# Patient Record
Sex: Female | Born: 1950 | Race: White | Hispanic: No | Marital: Married | Smoking: Former smoker
Health system: Southern US, Community
[De-identification: ages and names within clinical notes are randomized; demographics above are authoritative.]

## PROBLEM LIST (undated history)

## (undated) DIAGNOSIS — J449 Chronic obstructive pulmonary disease, unspecified: Secondary | ICD-10-CM

## (undated) DIAGNOSIS — I10 Essential (primary) hypertension: Secondary | ICD-10-CM

## (undated) DIAGNOSIS — J189 Pneumonia, unspecified organism: Secondary | ICD-10-CM

## (undated) DIAGNOSIS — C801 Malignant (primary) neoplasm, unspecified: Secondary | ICD-10-CM

## (undated) HISTORY — PX: BREAST SURGERY: SHX581

## (undated) HISTORY — PX: SKIN GRAFT: SHX250

## (undated) HISTORY — PX: OTHER SURGICAL HISTORY: SHX169

## (undated) HISTORY — PX: APPENDECTOMY: SHX54

## (undated) HISTORY — PX: TONSILLECTOMY: SUR1361

---

## 1999-05-06 ENCOUNTER — Emergency Department (HOSPITAL_COMMUNITY): Admission: EM | Admit: 1999-05-06 | Discharge: 1999-05-06 | Payer: Self-pay | Admitting: Emergency Medicine

## 1999-08-18 ENCOUNTER — Other Ambulatory Visit: Admission: RE | Admit: 1999-08-18 | Discharge: 1999-08-18 | Payer: Self-pay | Admitting: Obstetrics and Gynecology

## 1999-09-01 ENCOUNTER — Encounter: Admission: RE | Admit: 1999-09-01 | Discharge: 1999-09-01 | Payer: Self-pay | Admitting: Obstetrics and Gynecology

## 1999-09-01 ENCOUNTER — Encounter: Payer: Self-pay | Admitting: Obstetrics and Gynecology

## 2000-11-19 ENCOUNTER — Other Ambulatory Visit: Admission: RE | Admit: 2000-11-19 | Discharge: 2000-11-19 | Payer: Self-pay | Admitting: Obstetrics and Gynecology

## 2001-09-10 ENCOUNTER — Encounter: Payer: Self-pay | Admitting: Obstetrics and Gynecology

## 2001-09-10 ENCOUNTER — Encounter: Admission: RE | Admit: 2001-09-10 | Discharge: 2001-09-10 | Payer: Self-pay | Admitting: Obstetrics and Gynecology

## 2001-12-04 ENCOUNTER — Other Ambulatory Visit: Admission: RE | Admit: 2001-12-04 | Discharge: 2001-12-04 | Payer: Self-pay | Admitting: Obstetrics and Gynecology

## 2003-01-25 ENCOUNTER — Other Ambulatory Visit: Admission: RE | Admit: 2003-01-25 | Discharge: 2003-01-25 | Payer: Self-pay | Admitting: Obstetrics and Gynecology

## 2003-03-09 ENCOUNTER — Encounter: Admission: RE | Admit: 2003-03-09 | Discharge: 2003-03-09 | Payer: Self-pay | Admitting: Obstetrics and Gynecology

## 2004-02-03 ENCOUNTER — Other Ambulatory Visit: Admission: RE | Admit: 2004-02-03 | Discharge: 2004-02-03 | Payer: Self-pay | Admitting: Obstetrics and Gynecology

## 2004-02-14 ENCOUNTER — Encounter: Admission: RE | Admit: 2004-02-14 | Discharge: 2004-02-14 | Payer: Self-pay | Admitting: Obstetrics and Gynecology

## 2005-02-27 ENCOUNTER — Other Ambulatory Visit: Admission: RE | Admit: 2005-02-27 | Discharge: 2005-02-27 | Payer: Self-pay | Admitting: Obstetrics and Gynecology

## 2005-02-27 ENCOUNTER — Encounter: Admission: RE | Admit: 2005-02-27 | Discharge: 2005-02-27 | Payer: Self-pay | Admitting: Obstetrics and Gynecology

## 2006-03-04 ENCOUNTER — Encounter: Admission: RE | Admit: 2006-03-04 | Discharge: 2006-03-04 | Payer: Self-pay | Admitting: Obstetrics and Gynecology

## 2007-03-10 ENCOUNTER — Encounter: Admission: RE | Admit: 2007-03-10 | Discharge: 2007-03-10 | Payer: Self-pay | Admitting: Obstetrics and Gynecology

## 2008-04-20 ENCOUNTER — Encounter: Admission: RE | Admit: 2008-04-20 | Discharge: 2008-04-20 | Payer: Self-pay | Admitting: Obstetrics and Gynecology

## 2009-07-13 ENCOUNTER — Encounter: Admission: RE | Admit: 2009-07-13 | Discharge: 2009-07-13 | Payer: Self-pay | Admitting: Obstetrics and Gynecology

## 2010-05-12 ENCOUNTER — Encounter: Payer: Self-pay | Admitting: Pulmonary Disease

## 2010-05-12 ENCOUNTER — Ambulatory Visit (INDEPENDENT_AMBULATORY_CARE_PROVIDER_SITE_OTHER): Payer: 59 | Admitting: Pulmonary Disease

## 2010-05-12 VITALS — BP 128/66 | HR 101 | Temp 98.8°F | Ht 60.0 in | Wt 109.6 lb

## 2010-05-12 DIAGNOSIS — J439 Emphysema, unspecified: Secondary | ICD-10-CM | POA: Insufficient documentation

## 2010-05-12 DIAGNOSIS — J449 Chronic obstructive pulmonary disease, unspecified: Secondary | ICD-10-CM

## 2010-05-12 MED ORDER — VARENICLINE TARTRATE 0.5 MG X 11 & 1 MG X 42 PO MISC: ORAL | Status: AC

## 2010-05-12 NOTE — Patient Instructions (Signed)
Stay on symbicort am and pm.  Rinse mouth well Stay on spiriva one inhalation each am Use albuterol nebs or inhaler only for emergencies/rescue Will give you prescription for chantix to help with smoking cessation followup with me in 2 mos, and will check breathing tests same day

## 2010-05-12 NOTE — Assessment & Plan Note (Signed)
The pt most likely has copd with both fixed and reversible airflow obstruction.  Her recent spiro was done when she was acutely ill, so I expect repeat will be much better. She is on an excellent bronchodilator regimen, and would like for her to continue with this until I see her back in f/u.  I would hope with this and smoking cessation, there will be a significant improvement.  I have explained to her that ongoing smoking will only increase the likelihood of acute exacerbation, and therefore worsening baseline lung function

## 2010-05-12 NOTE — Progress Notes (Signed)
  Subjective:    Patient ID: Amber Chaney, female    DOB: Jan 17, 1950, 60 y.o.   MRN: [id number]  HPI The pt is a 59y/o female who I have been asked to see for dyspnea, obstructive lung disease.  She has a h/o chronic doe for years, but remained very functional.  In Mar of this year, had acute episode of worsening sob, along with chest tightness.  She was treated for an acute exacerbation with abx and course of prednisone, and is much improved.  She feels she is almost back to her usual baseline.  She did have spirometry during her acute illness, and this showed moderate to severe airflow obstruction. The pt is currently on spiriva and symbicort, and thinks it has helped.  Unfortunately, she is still smoking, but trying to quit.  She states that she currently can walk many blocks without significant sob, and has no issues with ADL's.  Minimal cough at this time, and no purulence   Review of Systems  Constitutional: Negative for fever and unexpected weight change.  HENT: Negative for ear pain, nosebleeds, congestion, sore throat, rhinorrhea, sneezing, trouble swallowing, dental problem, postnasal drip and sinus pressure.   Eyes: Negative for redness and itching.  Respiratory: Positive for cough and shortness of breath. Negative for chest tightness and wheezing.   Cardiovascular: Negative for palpitations and leg swelling.  Gastrointestinal: Negative for nausea and vomiting.  Genitourinary: Negative for dysuria.  Musculoskeletal: Negative for joint swelling.  Skin: Negative for rash.  Neurological: Negative for headaches.  Hematological: Does not bruise/bleed easily.  Psychiatric/Behavioral: Negative for dysphoric mood. The patient is not nervous/anxious.        Objective:   Physical Exam Constitutional:  Well developed, no acute distress  HENT:  Nares patent without discharge  Oropharynx without exudate, palate and uvula are normal  Eyes:  Perrla, eomi, no scleral icterus  Neck:  No  JVD, no TMG  Cardiovascular:  Normal rate, regular rhythm, no rubs or gallops.  No murmurs        Intact distal pulses  Pulmonary : mildly decreased breath sounds, no stridor or respiratory distress   No rales, rhonchi, or wheezing  Abdominal:  Soft, nondistended, bowel sounds present.  No tenderness noted.   Musculoskeletal:  No lower extremity edema noted.  Lymph Nodes:  No cervical lymphadenopathy noted  Skin:  No cyanosis noted  Neurologic:  Alert, appropriate, moves all 4 extremities without obvious deficit.         Assessment & Plan:

## 2010-07-13 ENCOUNTER — Ambulatory Visit: Payer: 59 | Admitting: Pulmonary Disease

## 2010-08-29 ENCOUNTER — Ambulatory Visit (INDEPENDENT_AMBULATORY_CARE_PROVIDER_SITE_OTHER): Payer: 59 | Admitting: Pulmonary Disease

## 2010-08-29 ENCOUNTER — Encounter: Payer: Self-pay | Admitting: Pulmonary Disease

## 2010-08-29 DIAGNOSIS — J441 Chronic obstructive pulmonary disease with (acute) exacerbation: Secondary | ICD-10-CM | POA: Insufficient documentation

## 2010-08-29 DIAGNOSIS — J449 Chronic obstructive pulmonary disease, unspecified: Secondary | ICD-10-CM

## 2010-08-29 MED ORDER — BUDESONIDE-FORMOTEROL FUMARATE 160-4.5 MCG/ACT IN AERO: 2.0000 | INHALATION_SPRAY | Freq: Two times a day (BID) | RESPIRATORY_TRACT | Status: DC

## 2010-08-29 MED ORDER — PREDNISONE 20 MG PO TABS: ORAL_TABLET | ORAL | Status: DC

## 2010-08-29 MED ORDER — CEFDINIR 300 MG PO CAPS: ORAL_CAPSULE | ORAL | Status: DC

## 2010-08-29 NOTE — Progress Notes (Signed)
Addended by: Salli Quarry on: 08/29/2010 10:39 AM   Modules accepted: Orders

## 2010-08-29 NOTE — Patient Instructions (Signed)
Will treat you with a course of prednisone. Will give you a prescription for an antibiotic.  Can either start taking now, or see if your mucus production worsens before starting.  You need to quit smoking.  Consider smoking cessation classes.  followup with me in 4 weeks.

## 2010-08-29 NOTE — Progress Notes (Signed)
  Subjective:    Patient ID: Amber Chaney, female    DOB: 1950-04-22, 60 y.o.   MRN: [id number]  HPI The patient comes in today for an acute sick visit.  She has a history of "COPD" but has never had spirometry.  I suspect she has recurrent asthmatic bronchitis related to her ongoing smoking.  She was supposed to followup in June for spirometry after an acute exacerbation, but failed to do so.  She has continued to smoke, and comes in today with a one-week history of worsening congestion and cough with purulent mucus.  She does not feel overly short of breath.  She has been compliant with her bronchodilator regimen.   Review of Systems  Constitutional: Negative for fever and unexpected weight change.  HENT: Positive for congestion, rhinorrhea, sneezing, postnasal drip and sinus pressure. Negative for ear pain, nosebleeds, sore throat, trouble swallowing and dental problem.   Eyes: Negative for redness and itching.  Respiratory: Positive for cough, chest tightness, shortness of breath and wheezing.   Cardiovascular: Negative for palpitations and leg swelling.  Gastrointestinal: Negative for nausea and vomiting.  Genitourinary: Negative for dysuria.  Musculoskeletal: Negative for joint swelling.  Skin: Negative for rash.  Neurological: Negative for headaches.  Hematological: Does not bruise/bleed easily.  Psychiatric/Behavioral: Negative for dysphoric mood. The patient is not nervous/anxious.        Objective:   Physical Exam Well-developed female in no acute distress Nose without obvious discharge or purulence Chest with decreased breath sounds, no wheezes or rhonchi Cardiac with regular rate and rhythm, no murmurs rubs or gallops Lower extremities without edema, no cyanosis noted Alert and oriented, moves all 4       Assessment & Plan:

## 2010-08-29 NOTE — Assessment & Plan Note (Signed)
The patient comes in today with an episode of acute asthmatic bronchitis.  She will need at least a course of prednisone, and I think she would also benefit from an antibiotic given her history of purulent mucus.  I have explained to her this will be an ongoing problem unless she is able to quit smoking.  We still need to do spirometry to establish her baseline.  We'll treat with a course of prednisone and an antibiotic and see how she responds.  She is currently using the patch, and I have given her a flyer for the smoking cessation classes at the hospital.

## 2010-09-06 ENCOUNTER — Other Ambulatory Visit: Payer: Self-pay | Admitting: Obstetrics and Gynecology

## 2010-09-06 DIAGNOSIS — Z1231 Encounter for screening mammogram for malignant neoplasm of breast: Secondary | ICD-10-CM

## 2010-09-12 ENCOUNTER — Other Ambulatory Visit: Payer: Self-pay | Admitting: Obstetrics and Gynecology

## 2010-09-12 DIAGNOSIS — Z1211 Encounter for screening for malignant neoplasm of colon: Secondary | ICD-10-CM

## 2010-09-15 ENCOUNTER — Ambulatory Visit
Admission: RE | Admit: 2010-09-15 | Discharge: 2010-09-15 | Disposition: A | Discharge status: Home / Self Care | Payer: 59 | Source: Ambulatory Visit | Attending: Obstetrics and Gynecology | Admitting: Obstetrics and Gynecology

## 2010-09-15 DIAGNOSIS — Z1231 Encounter for screening mammogram for malignant neoplasm of breast: Secondary | ICD-10-CM

## 2010-09-26 ENCOUNTER — Encounter: Payer: Self-pay | Admitting: Pulmonary Disease

## 2010-09-26 ENCOUNTER — Ambulatory Visit (INDEPENDENT_AMBULATORY_CARE_PROVIDER_SITE_OTHER): Payer: 59 | Admitting: Pulmonary Disease

## 2010-09-26 VITALS — BP 120/76 | HR 97 | Temp 98.6°F | Ht 60.0 in | Wt 108.2 lb

## 2010-09-26 DIAGNOSIS — R0600 Dyspnea, unspecified: Secondary | ICD-10-CM

## 2010-09-26 DIAGNOSIS — J449 Chronic obstructive pulmonary disease, unspecified: Secondary | ICD-10-CM

## 2010-09-26 DIAGNOSIS — R0989 Other specified symptoms and signs involving the circulatory and respiratory systems: Secondary | ICD-10-CM

## 2010-09-26 NOTE — Assessment & Plan Note (Signed)
The patient has moderate airflow obstruction at a reasonable baseline, and I have explained to her it is unclear how much of this is fixed and related to emphysema versus reversible from her airway inflammation with ongoing smoking.  I think she is on an excellent bronchodilator regimen, and she has seen improvement since being on this and cutting back on smoking.  I have asked her to continue her medications, and try and totally quit tobacco use.

## 2010-09-26 NOTE — Patient Instructions (Signed)
No change in meds Continue to work on smoking cessation. If doing ok, followup with me in 6mos.

## 2010-09-26 NOTE — Progress Notes (Signed)
  Subjective:    Patient ID: Amber Chaney, female    DOB: 1950/03/08, 60 y.o.   MRN: [id number]  HPI The pt comes in today for f/u of her obstructive lung disease.  She has cut way back on her smoking, and feels she is doing better.  She has gotten over her recent acute exacerbation from last visit.  She is maintaining on symbicort and Spiriva, and feels they're helping her breathing.  I discussed smoking cessation with her again and she states that she will try and totally quit.  She is scheduled to have spirometry today since she has been sick each time we have tried to do it in the past.   Review of Systems  Constitutional: Negative for fever and unexpected weight change.  HENT: Positive for sneezing. Negative for ear pain, nosebleeds, congestion, sore throat, rhinorrhea, trouble swallowing, dental problem, postnasal drip and sinus pressure.   Eyes: Negative for redness and itching.  Respiratory: Positive for cough. Negative for chest tightness, shortness of breath and wheezing.   Cardiovascular: Negative for palpitations and leg swelling.  Gastrointestinal: Negative for nausea and vomiting.  Genitourinary: Negative for dysuria.  Musculoskeletal: Negative for joint swelling.  Skin: Negative for rash.  Neurological: Negative for headaches.  Hematological: Does not bruise/bleed easily.  Psychiatric/Behavioral: Negative for dysphoric mood. The patient is not nervous/anxious.        Objective:   Physical Exam Wd female in nad Nose without purulence or discharge Chest with decreased bs, no wheezing Cor with rrr LE without edema, no cyanosis Alert and oriented, moves all 4        Assessment & Plan:

## 2010-10-23 ENCOUNTER — Ambulatory Visit
Admission: RE | Admit: 2010-10-23 | Discharge: 2010-10-23 | Disposition: A | Discharge status: Home / Self Care | Payer: 59 | Source: Ambulatory Visit | Attending: Obstetrics and Gynecology | Admitting: Obstetrics and Gynecology

## 2010-10-23 DIAGNOSIS — Z1211 Encounter for screening for malignant neoplasm of colon: Secondary | ICD-10-CM

## 2010-11-01 ENCOUNTER — Other Ambulatory Visit: Payer: Self-pay | Admitting: Pulmonary Disease

## 2011-02-15 ENCOUNTER — Other Ambulatory Visit: Payer: Self-pay | Admitting: Pulmonary Disease

## 2011-03-27 ENCOUNTER — Ambulatory Visit: Payer: 59 | Admitting: Pulmonary Disease

## 2011-04-17 ENCOUNTER — Encounter: Payer: Self-pay | Admitting: Pulmonary Disease

## 2011-04-17 ENCOUNTER — Ambulatory Visit (INDEPENDENT_AMBULATORY_CARE_PROVIDER_SITE_OTHER): Payer: 59 | Admitting: Pulmonary Disease

## 2011-04-17 VITALS — BP 142/78 | HR 88 | Temp 98.1°F | Ht 59.5 in | Wt 111.4 lb

## 2011-04-17 DIAGNOSIS — J449 Chronic obstructive pulmonary disease, unspecified: Secondary | ICD-10-CM

## 2011-04-17 NOTE — Assessment & Plan Note (Signed)
The patient has done very well on her current bronchodilator regimen, it has improved further since quitting smoking.  I have congratulated her on this, and encouraged her to continue.  I have asked her to remain on her current medications until she has been quit for 2 months, and then she can try stopping her Spiriva.  I will see her back in 6 months, or sooner if having issues.

## 2011-04-17 NOTE — Progress Notes (Signed)
  Subjective:    Patient ID: Amber Chaney, female    DOB: 07/03/1950, 61 y.o.   MRN: [id number]  HPI Patient comes in today for followup of her known COPD.  She has quit smoking about 10 days ago, and has noticed a significant improvement in her cough, congestion, as well as her shortness of breath.  She has maintained on her current bronchodilator regimen, and feels that she is doing well.   Review of Systems  Constitutional: Negative for fever and unexpected weight change.  HENT: Negative for ear pain, nosebleeds, congestion, sore throat, rhinorrhea, sneezing, trouble swallowing, dental problem, postnasal drip and sinus pressure.   Eyes: Negative for redness and itching.  Respiratory: Negative for cough, chest tightness, shortness of breath and wheezing.   Cardiovascular: Negative for palpitations and leg swelling.  Gastrointestinal: Negative for nausea and vomiting.  Genitourinary: Negative for dysuria.  Musculoskeletal: Negative for joint swelling.  Skin: Negative for rash.  Neurological: Negative for headaches.  Hematological: Does not bruise/bleed easily.  Psychiatric/Behavioral: Negative for dysphoric mood. The patient is not nervous/anxious.        Objective:   Physical Exam Well-developed female in no acute distress Nose without purulence or discharge noted Chest with mild decrease in breath sounds, but adequate air flow and no wheezing Cardiac exam with regular rate and rhythm Lower extremities without edema, no cyanosis noted Alert and oriented, moves all 4 extremities.       Assessment & Plan:

## 2011-04-17 NOTE — Patient Instructions (Signed)
Congratulations on stopping smoking!!  Keep it up Stay on current medications, but if doing well in a few months off cigarettes, ok to try dropping spiriva and staying on symbicort alone. Work on conditioning, exercise. followup with me in 6 mos if doing well.

## 2011-05-04 ENCOUNTER — Other Ambulatory Visit: Payer: Self-pay | Admitting: Pulmonary Disease

## 2011-05-07 ENCOUNTER — Encounter (HOSPITAL_BASED_OUTPATIENT_CLINIC_OR_DEPARTMENT_OTHER): Payer: Self-pay

## 2011-05-07 ENCOUNTER — Emergency Department (HOSPITAL_BASED_OUTPATIENT_CLINIC_OR_DEPARTMENT_OTHER)
Admission: EM | Admit: 2011-05-07 | Discharge: 2011-05-07 | Disposition: A | Discharge status: Home / Self Care | Payer: 59 | Attending: Emergency Medicine | Admitting: Emergency Medicine

## 2011-05-07 ENCOUNTER — Emergency Department (INDEPENDENT_AMBULATORY_CARE_PROVIDER_SITE_OTHER): Payer: 59

## 2011-05-07 DIAGNOSIS — R059 Cough, unspecified: Secondary | ICD-10-CM | POA: Insufficient documentation

## 2011-05-07 DIAGNOSIS — J45901 Unspecified asthma with (acute) exacerbation: Secondary | ICD-10-CM

## 2011-05-07 DIAGNOSIS — J3489 Other specified disorders of nose and nasal sinuses: Secondary | ICD-10-CM | POA: Insufficient documentation

## 2011-05-07 DIAGNOSIS — R05 Cough: Secondary | ICD-10-CM | POA: Insufficient documentation

## 2011-05-07 DIAGNOSIS — R0602 Shortness of breath: Secondary | ICD-10-CM

## 2011-05-07 MED ORDER — ALBUTEROL SULFATE (5 MG/ML) 0.5% IN NEBU
5.0000 mg | INHALATION_SOLUTION | Freq: Once | RESPIRATORY_TRACT | Status: AC
  Administered 2011-05-07: 5 mg via RESPIRATORY_TRACT

## 2011-05-07 MED ORDER — ALBUTEROL SULFATE (5 MG/ML) 0.5% IN NEBU
INHALATION_SOLUTION | RESPIRATORY_TRACT | Status: AC
  Administered 2011-05-07: 5 mg via RESPIRATORY_TRACT
  Filled 2011-05-07: qty 1

## 2011-05-07 MED ORDER — PREDNISONE 20 MG PO TABS: 40.0000 mg | ORAL_TABLET | Freq: Every day | ORAL | Status: DC

## 2011-05-07 MED ORDER — IPRATROPIUM BROMIDE 0.02 % IN SOLN
0.5000 mg | Freq: Once | RESPIRATORY_TRACT | Status: AC
  Administered 2011-05-07: 0.5 mg via RESPIRATORY_TRACT

## 2011-05-07 MED ORDER — IPRATROPIUM BROMIDE 0.02 % IN SOLN
RESPIRATORY_TRACT | Status: AC
  Administered 2011-05-07: 0.5 mg via RESPIRATORY_TRACT
  Filled 2011-05-07: qty 2.5

## 2011-05-07 NOTE — ED Notes (Signed)
Pt was given HHN tx and stated that she felt much better after the fourth overrall HH tx. Pt will be going to X-Ray.

## 2011-05-07 NOTE — ED Notes (Signed)
Was at bedside while pt was receiving breathing treatment pt states that she is now breathing easier than she was before she called EMS.

## 2011-05-07 NOTE — ED Notes (Signed)
Pt has had a total of  three tx of HHN Albuterol/ Atrovent while in route to the ED. EMS gave pt HHN x 2 to the pt and pt had one tx at home. Pt states that she has asthma and has two cats and dogs which triggered the reaction. Pt states that she has had the pets for a while and also could be the pollen that may have caused the reaction.

## 2011-05-07 NOTE — Discharge Instructions (Signed)
Asthma Attack Prevention HOW CAN ASTHMA BE PREVENTED? Currently, there is no way to prevent asthma from starting. However, you can take steps to control the disease and prevent its symptoms after you have been diagnosed. Learn about your asthma and how to control it. Take an active role to control your asthma by working with your caregiver to create and follow an asthma action plan. An asthma action plan guides you in taking your medicines properly, avoiding factors that make your asthma worse, tracking your level of asthma control, responding to worsening asthma, and seeking emergency care when needed. To track your asthma, keep records of your symptoms, check your peak flow number using a peak flow meter (handheld device that shows how well air moves out of your lungs), and get regular asthma checkups.  Other ways to prevent asthma attacks include:  Use medicines as your caregiver directs.   Identify and avoid things that make your asthma worse (as much as you can).   Keep track of your asthma symptoms and level of control.   Get regular checkups for your asthma.   With your caregiver, write a detailed plan for taking medicines and managing an asthma attack. Then be sure to follow your action plan. Asthma is an ongoing condition that needs regular monitoring and treatment.   Identify and avoid asthma triggers. A number of outdoor allergens and irritants (pollen, mold, cold air, air pollution) can trigger asthma attacks. Find out what causes or makes your asthma worse, and take steps to avoid those triggers (see below).   Monitor your breathing. Learn to recognize warning signs of an attack, such as slight coughing, wheezing or shortness of breath. However, your lung function may already decrease before you notice any signs or symptoms, so regularly measure and record your peak airflow with a home peak flow meter.   Identify and treat attacks early. If you act quickly, you're less likely to have  a severe attack. You will also need less medicine to control your symptoms. When your peak flow measurements decrease and alert you to an upcoming attack, take your medicine as instructed, and immediately stop any activity that may have triggered the attack. If your symptoms do not improve, get medical help.   Pay attention to increasing quick-relief inhaler use. If you find yourself relying on your quick-relief inhaler (such as albuterol), your asthma is not under control. See your caregiver about adjusting your treatment.  IDENTIFY AND CONTROL FACTORS THAT MAKE YOUR ASTHMA WORSE A number of common things can set off or make your asthma symptoms worse (asthma triggers). Keep track of your asthma symptoms for several weeks, detailing all the environmental and emotional factors that are linked with your asthma. When you have an asthma attack, go back to your asthma diary to see which factor, or combination of factors, might have contributed to it. Once you know what these factors are, you can take steps to control many of them.  Allergies: If you have allergies and asthma, it is important to take asthma prevention steps at home. Asthma attacks (worsening of asthma symptoms) can be triggered by allergies, which can cause temporary increased inflammation of your airways. Minimizing contact with the substance to which you are allergic will help prevent an asthma attack. Animal Dander:   Some people are allergic to the flakes of skin or dried saliva from animals with fur or feathers. Keep these pets out of your home.   If you can't keep a pet outdoors, keep the   pet out of your bedroom and other sleeping areas at all times, and keep the door closed.   Remove carpets and furniture covered with cloth from your home. If that is not possible, keep the pet away from fabric-covered furniture and carpets.  Dust Mites:  Many people with asthma are allergic to dust mites. Dust mites are tiny bugs that are found in  every home, in mattresses, pillows, carpets, fabric-covered furniture, bedcovers, clothes, stuffed toys, fabric, and other fabric-covered items.   Cover your mattress in a special dust-proof cover.   Cover your pillow in a special dust-proof cover, or wash the pillow each week in hot water. Water must be hotter than 130 F to kill dust mites. Cold or warm water used with detergent and bleach can also be effective.   Wash the sheets and blankets on your bed each week in hot water.   Try not to sleep or lie on cloth-covered cushions.   Call ahead when traveling and ask for a smoke-free hotel room. Bring your own bedding and pillows, in case the hotel only supplies feather pillows and down comforters, which may contain dust mites and cause asthma symptoms.   Remove carpets from your bedroom and those laid on concrete, if you can.   Keep stuffed toys out of the bed, or wash the toys weekly in hot water or cooler water with detergent and bleach.  Cockroaches:  Many people with asthma are allergic to the droppings and remains of cockroaches.   Keep food and garbage in closed containers. Never leave food out.   Use poison baits, traps, powders, gels, or paste (for example, boric acid).   If a spray is used to kill cockroaches, stay out of the room until the odor goes away.  Indoor Mold:  Fix leaky faucets, pipes, or other sources of water that have mold around them.   Clean moldy surfaces with a cleaner that has bleach in it.  Pollen and Outdoor Mold:  When pollen or mold spore counts are high, try to keep your windows closed.   Stay indoors with windows closed from late morning to afternoon, if you can. Pollen and some mold spore counts are highest at that time.   Ask your caregiver whether you need to take or increase anti-inflammatory medicine before your allergy season starts.  Irritants:   Tobacco smoke is an irritant. If you smoke, ask your caregiver how you can quit. Ask family  members to quit smoking, too. Do not allow smoking in your home or car.   If possible, do not use a wood-burning stove, kerosene heater, or fireplace. Minimize exposure to all sources of smoke, including incense, candles, fires, and fireworks.   Try to stay away from strong odors and sprays, such as perfume, talcum powder, hair spray, and paints.   Decrease humidity in your home and use an indoor air cleaning device. Reduce indoor humidity to below 60 percent. Dehumidifiers or central air conditioners can do this.   Try to have someone else vacuum for you once or twice a week, if you can. Stay out of rooms while they are being vacuumed and for a short while afterward.   If you vacuum, use a dust mask from a hardware store, a double-layered or microfilter vacuum cleaner bag, or a vacuum cleaner with a HEPA filter.   Sulfites in foods and beverages can be irritants. Do not drink beer or wine, or eat dried fruit, processed potatoes, or shrimp if they cause asthma   symptoms.   Cold air can trigger an asthma attack. Cover your nose and mouth with a scarf on cold or windy days.   Several health conditions can make asthma more difficult to manage, including runny nose, sinus infections, reflux disease, psychological stress, and sleep apnea. Your caregiver will treat these conditions, as well.   Avoid close contact with people who have a cold or the flu, since your asthma symptoms may get worse if you catch the infection from them. Wash your hands thoroughly after touching items that may have been handled by people with a respiratory infection.   Get a flu shot every year to protect against the flu virus, which often makes asthma worse for days or weeks. Also get a pneumonia shot once every five to 10 years.  Drugs:  Aspirin and other painkillers can cause asthma attacks. 10% to 20% of people with asthma have sensitivity to aspirin or a group of painkillers called non-steroidal anti-inflammatory drugs  (NSAIDS), such as ibuprofen and naproxen. These drugs are used to treat pain and reduce fevers. Asthma attacks caused by any of these medicines can be severe and even fatal. These drugs must be avoided in people who have known aspirin sensitive asthma. Products with acetaminophen are considered safe for people who have asthma. It is important that people with aspirin sensitivity read labels of all over-the-counter drugs used to treat pain, colds, coughs, and fever.   Beta blockers and ACE inhibitors are other drugs which you should discuss with your caregiver, in relation to your asthma.  ALLERGY SKIN TESTING  Ask your asthma caregiver about allergy skin testing or blood testing (RAST test) to identify the allergens to which you are sensitive. If you are found to have allergies, allergy shots (immunotherapy) for asthma may help prevent future allergies and asthma. With allergy shots, small doses of allergens (substances to which you are allergic) are injected under your skin on a regular schedule. Over a period of time, your body may become used to the allergen and less responsive with asthma symptoms. You can also take measures to minimize your exposure to those allergens. EXERCISE  If you have exercise-induced asthma, or are planning vigorous exercise, or exercise in cold, humid, or dry environments, prevent exercise-induced asthma by following your caregiver's advice regarding asthma treatment before exercising. Document Released: 12/20/2008 Document Revised: 12/21/2010 Document Reviewed: 12/20/2008 [employer] Patient Information 2012 [employer]. 

## 2011-05-07 NOTE — ED Provider Notes (Signed)
History     CSN: [id number]  Arrival date & time 05/07/11  1945   First MD Initiated Contact with Patient 05/07/11 2000      Chief Complaint  Patient presents with  . Asthma    (Consider location/radiation/quality/duration/timing/severity/associated sxs/prior treatment) HPI Comments: Pt states that she has progressively had worsening symptoms with her asthma today:pt states that she is feeling a lot better after the treatments with ems which included steroids and breathing treatments  Patient is a 61 y.o. female presenting with asthma. The history is provided by the patient. No language interpreter was used.  Asthma This is a new problem. The current episode started in the past 7 days. The problem occurs constantly. The problem has been rapidly worsening. Associated symptoms include congestion and coughing. Pertinent negatives include no fever. The symptoms are aggravated by nothing. Treatments tried: albuterol.    Past Medical History  Diagnosis Date  . Allergic rhinitis   . Asthma     Past Surgical History  Procedure Date  . Breast surgery   . Appendectomy   . Tumor removed     from arms d/t cold and flu shots  . Tonsillectomy     Family History  Problem Relation Age of Onset  . Allergies Mother   . Asthma Mother     History  Substance Use Topics  . Smoking status: Former Smoker -- 0.3 packs/day for 40 years    Types: Cigarettes    Quit date: 04/07/2011  . Smokeless tobacco: Not on file  . Alcohol Use: Not on file    OB History    Grav Para Term Preterm Abortions TAB SAB Ect Mult Living                  Review of Systems  Constitutional: Negative for fever.  HENT: Positive for congestion and rhinorrhea.   Respiratory: Positive for cough.   Cardiovascular: Negative.   Neurological: Negative.     Allergies  Cold and flu  Home Medications   Current Outpatient Rx  Name Route Sig Dispense Refill  . ALBUTEROL SULFATE (2.5 MG/3ML) 0.083% IN NEBU  Nebulization Take 2.5 mg by nebulization every 6 (six) hours as needed.     . OMEGA-3 FATTY ACIDS 1000 MG PO CAPS Oral Take 1 capsule by mouth daily.      Marland Kitchen FLUTICASONE PROPIONATE 50 MCG/ACT NA SUSP Nasal 1 spray by Nasal route 2 (two) times daily.      . SYMBICORT 160-4.5 MCG/ACT IN AERO  INHALE 2 PUFFS TWICE DAILY 10.2 g 2  . TIOTROPIUM BROMIDE MONOHYDRATE 18 MCG IN CAPS Inhalation Place 18 mcg into inhaler and inhale daily.      . VENTOLIN HFA 108 (90 BASE) MCG/ACT IN AERS  INHALE 2 PUFFS INTO THE LUNGS EVERY 6 HOURS AS NEEDED FOR SHORTNESS OF BREATH 1 Inhaler 3  . IBANDRONATE SODIUM 150 MG PO TABS Oral Take 150 mg by mouth every 30 (thirty) days. Take in the morning with a full glass of water, on an empty stomach, and do not take anything else by mouth or lie down for the next 30 min.     . METHYLPREDNISOLONE SODIUM SUCC 125 MG IJ SOLR Intravenous Inject into the vein once.    Marland Kitchen VITAMIN D (ERGOCALCIFEROL) 50000 UNITS PO CAPS Oral Take 50,000 Units by mouth every 7 (seven) days.        BP 133/69  Pulse 114  Temp 97.5 F (36.4 C)  Resp 24  SpO2  96%  Physical Exam  Nursing note and vitals reviewed. Constitutional: She is oriented to person, place, and time. She appears well-developed and well-nourished.  HENT:  Head: Normocephalic and atraumatic.  Eyes: Conjunctivae are normal.  Neck: Neck supple.  Cardiovascular: Normal rate and regular rhythm.   Pulmonary/Chest: She has wheezes.  Neurological: She is alert and oriented to person, place, and time.  Skin: Skin is warm and dry.    ED Course  Procedures (including critical care time)  Labs Reviewed - No data to display Dg Chest 2 View  05/07/2011  *RADIOLOGY REPORT*  Clinical Data: Asthma exacerbation, shortness of breath  CHEST - 2 VIEW  Comparison: None.  Findings: Lungs are hyperinflated.  No focal infiltrate.  There is mild central peribronchial thickening.  Peripheral calcification around breast implants.  No effusion.  Heart  size normal.  Regional bones unremarkable.  IMPRESSION:  1.  Mild hyperinflation and central peribronchial thickening.  No focal infiltrate.  Original Report Authenticated By: Thora Lance III, M.D.     1. Asthma attack       MDM  Pt is doing better at this time:pt had solumedrol with ems        Teressa Lower, NP 05/07/11 2118

## 2011-05-07 NOTE — ED Notes (Signed)
Pt sts having problems breathing after work.Has taken 3 breathing tx before work and one this pm before calling EMTs

## 2011-05-08 MED FILL — Methylprednisolone Sod Succ For Inj 125 MG (Base Equiv): INTRAMUSCULAR | Qty: 2 | Status: AC

## 2011-05-08 NOTE — ED Provider Notes (Signed)
Medical screening examination/treatment/procedure(s) were performed by non-physician practitioner and as supervising physician I was immediately available for consultation/collaboration.  Cyndra Numbers, MD 05/08/11 1459

## 2011-05-15 ENCOUNTER — Emergency Department (HOSPITAL_BASED_OUTPATIENT_CLINIC_OR_DEPARTMENT_OTHER)
Admission: EM | Admit: 2011-05-15 | Discharge: 2011-05-15 | Discharge status: Against Medical Advice | Payer: 59 | Attending: Emergency Medicine | Admitting: Emergency Medicine

## 2011-05-15 ENCOUNTER — Encounter (HOSPITAL_BASED_OUTPATIENT_CLINIC_OR_DEPARTMENT_OTHER): Payer: Self-pay | Admitting: Student

## 2011-05-15 ENCOUNTER — Telehealth: Payer: Self-pay | Admitting: Pulmonary Disease

## 2011-05-15 DIAGNOSIS — R0602 Shortness of breath: Secondary | ICD-10-CM | POA: Insufficient documentation

## 2011-05-15 HISTORY — DX: Chronic obstructive pulmonary disease, unspecified: J44.9

## 2011-05-15 NOTE — ED Notes (Signed)
Pt in with c/o extreme SOB unrelieved by inhalers. Reports SOB with and without exertion.

## 2011-05-15 NOTE — Telephone Encounter (Signed)
I spoke with pt and she states she went to the ED on 05/07/11 for asthma attack. She was given pred 20 mg BID to take. She states she still doesn't feel like her breathing is how it should be. Pt is scheduled to come in and see TP tomorrow at 11:45 for an evaluation. Pt aware if she worsens then to seek emergency care. She voiced her understanding and needed nothing further

## 2011-05-15 NOTE — ED Notes (Signed)
Pt has an hx of COPD/asthma that presented to the ED with some SHOB. Pt was ausculted with clear diminished bilaterally.

## 2011-05-16 ENCOUNTER — Ambulatory Visit (INDEPENDENT_AMBULATORY_CARE_PROVIDER_SITE_OTHER): Payer: 59 | Admitting: Adult Health

## 2011-05-16 ENCOUNTER — Encounter: Payer: Self-pay | Admitting: Adult Health

## 2011-05-16 VITALS — BP 126/84 | HR 108 | Temp 98.3°F | Ht 60.0 in | Wt 108.2 lb

## 2011-05-16 DIAGNOSIS — J441 Chronic obstructive pulmonary disease with (acute) exacerbation: Secondary | ICD-10-CM

## 2011-05-16 MED ORDER — AMOXICILLIN-POT CLAVULANATE 875-125 MG PO TABS: 1.0000 | ORAL_TABLET | Freq: Two times a day (BID) | ORAL | Status: AC

## 2011-05-16 MED ORDER — PREDNISONE 10 MG PO TABS: ORAL_TABLET | ORAL | Status: DC

## 2011-05-16 NOTE — Patient Instructions (Addendum)
Begin Prednisone taper over next week.  Augmentin 875mg Twice daily  For 7 days  Zyrtec 10mg At bedtime   Mucinex DM Twice daily  As needed  Cough/congestion  Please contact office for sooner follow up if symptoms do not improve or worsen or seek emergency care  follow up Dr. Clance in 1 week and As needed    

## 2011-05-16 NOTE — Progress Notes (Signed)
  Subjective:    Patient ID: Amber Chaney, female    DOB: 1950/11/04, 61 y.o.   MRN: [id number]  HPI 61 yo female with known hx of COPD    05/16/2011 Acute OV  Complains of tightness in chest, increased SOB, wheezing x10days, began coughing today with yellow mucus.  went to ED 4.22.13. cXR -NAD. Received Medrol dose with some help until she finished them. Continue to have wheezing and now cough is getting worse with yellow mucus.  No fever or hemotpysis. No exertional chest pain , hemoptysis, fever or edema. No calf pain  No desaturations today in office with walking 3 laps at brisk pace w/ lowest sat 93%.    Review of Systems Constitutional:   No  weight loss, night sweats,  Fevers, chills, fatigue, or  lassitude.  HEENT:   No headaches,  Difficulty swallowing,  Tooth/dental problems, or  Sore throat,                No sneezing, itching, ear ache, nasal congestion, post nasal drip,   CV:  No chest pain,  Orthopnea, PND, swelling in lower extremities, anasarca, dizziness, palpitations, syncope.   GI  No heartburn, indigestion, abdominal pain, nausea, vomiting, diarrhea, change in bowel habits, loss of appetite, bloody stools.   Resp: ,  No coughing up of blood.  No change in color of mucus.  No chest wall deformity  Skin: no rash or lesions.  GU: no dysuria, change in color of urine, no urgency or frequency.  No flank pain, no hematuria   MS:  No joint pain or swelling.  No decreased range of motion.  No back pain.  Psych:  No change in mood or affect. No depression or anxiety.  No memory loss.         Objective:   Physical Exam GEN: A/Ox3; pleasant , NAD, well nourished   HEENT:  [place]/AT,  EACs-clear, TMs-wnl, NOSE-clear, THROAT-clear, no lesions, no postnasal drip or exudate noted.   NECK:  Supple w/ fair ROM; no JVD; normal carotid impulses w/o bruits; no thyromegaly or nodules palpated; no lymphadenopathy.  RESP  Coarse BS no accessory muscle use, no dullness to  percussion  CARD:  RRR, no m/r/g  , no peripheral edema, pulses intact, no cyanosis or clubbing.  GI:   Soft & nt; nml bowel sounds; no organomegaly or masses detected.  Musco: Warm bil, no deformities or joint swelling noted.   Neuro: alert, no focal deficits noted.    Skin: Warm, no lesions or rashes         Assessment & Plan:

## 2011-05-17 NOTE — Assessment & Plan Note (Signed)
Begin Prednisone taper over next week.  Augmentin 875mg  Twice daily  For 7 days  Zyrtec 10mg  At bedtime   Mucinex DM Twice daily  As needed  Cough/congestion  Please contact office for sooner follow up if symptoms do not improve or worsen or seek emergency care  follow up Dr. Shelle Iron in 1 week and As needed

## 2011-05-18 NOTE — Progress Notes (Signed)
Ov reviewed and agree with plan as outlined.  

## 2011-05-29 ENCOUNTER — Ambulatory Visit (INDEPENDENT_AMBULATORY_CARE_PROVIDER_SITE_OTHER): Payer: 59 | Admitting: Pulmonary Disease

## 2011-05-29 ENCOUNTER — Encounter: Payer: Self-pay | Admitting: Pulmonary Disease

## 2011-05-29 VITALS — BP 104/68 | HR 88 | Temp 98.7°F | Ht 60.0 in | Wt 110.2 lb

## 2011-05-29 DIAGNOSIS — J449 Chronic obstructive pulmonary disease, unspecified: Secondary | ICD-10-CM

## 2011-05-29 NOTE — Patient Instructions (Signed)
No change in current medications.  If you are doing well mid June, can try coming off spiriva. Keep followup apptm with me in Sept.

## 2011-05-29 NOTE — Progress Notes (Signed)
  Subjective:    Patient ID: Amber Chaney, female    DOB: 02-02-1950, 61 y.o.   MRN: [id number]  HPI The patient comes in today for followup after a recent acute COPD exacerbation.  She was treated with antibiotics and a course of prednisone, and has since returned back to her usual baseline.  She feels that her breathing is much improved, and denies any cough or congestion.   Review of Systems  Constitutional: Negative.  Negative for fever and unexpected weight change.  HENT: Positive for congestion and sneezing. Negative for ear pain, nosebleeds, sore throat, rhinorrhea, trouble swallowing, dental problem, postnasal drip and sinus pressure.   Eyes: Negative.  Negative for redness and itching.  Respiratory: Negative.  Negative for cough, chest tightness, shortness of breath and wheezing.   Cardiovascular: Negative.  Negative for palpitations and leg swelling.  Gastrointestinal: Negative.  Negative for nausea and vomiting.  Genitourinary: Negative.  Negative for dysuria.  Musculoskeletal: Negative.  Negative for joint swelling.  Skin: Negative.  Negative for rash.  Neurological: Negative.  Negative for headaches.  Hematological: Negative.  Does not bruise/bleed easily.  Psychiatric/Behavioral: Negative.  Negative for dysphoric mood. The patient is not nervous/anxious.        Objective:   Physical Exam Thin female in no acute distress Nose without purulence or discharge noted Chest totally clear to auscultation, no wheezing Cardiac exam with regular rate and rhythm Lower extremities without edema, no cyanosis Alert and oriented, moves all 4 extremities.       Assessment & Plan:

## 2011-05-29 NOTE — Assessment & Plan Note (Signed)
The patient is doing well and has returned to baseline after her recent COPD exacerbation.  It is unclear what started all of this, and it may have simply been related to the poor weather and increased pollen.  I have asked her to continue on her current regimen, and to stay as active as possible.  She is to keep her scheduled followup in the fall, but call if she has any further issues.

## 2011-10-10 ENCOUNTER — Other Ambulatory Visit: Payer: Self-pay | Admitting: Obstetrics and Gynecology

## 2011-10-10 DIAGNOSIS — Z1231 Encounter for screening mammogram for malignant neoplasm of breast: Secondary | ICD-10-CM

## 2011-10-17 ENCOUNTER — Ambulatory Visit (INDEPENDENT_AMBULATORY_CARE_PROVIDER_SITE_OTHER): Payer: 59 | Admitting: Pulmonary Disease

## 2011-10-17 ENCOUNTER — Encounter: Payer: Self-pay | Admitting: Pulmonary Disease

## 2011-10-17 VITALS — BP 132/68 | HR 102 | Ht 60.0 in | Wt 117.0 lb

## 2011-10-17 DIAGNOSIS — J449 Chronic obstructive pulmonary disease, unspecified: Secondary | ICD-10-CM

## 2011-10-17 NOTE — Assessment & Plan Note (Signed)
The patient did very well since the last visit until August of this year when she had an acute exacerbation after trying to come off Spiriva.  It is unclear if it was because of discontinuation of the medication, or if it was something else entirely.  She is now back to baseline and doing well.  I have asked her to continue on her current regimen, but could try discontinuing the Spiriva again if she wishes.  I also asked her to stay on her current exercise regimen.

## 2011-10-17 NOTE — Progress Notes (Signed)
  Subjective:    Patient ID: Amber Chaney, female    DOB: 01/31/1950, 61 y.o.   MRN: [id number]  HPI The patient comes in today for followup of her known emphysema.  She has been compliant on her bronchodilator regimen, and feels that she is doing well currently.  She did have a flareup in August of this year while visiting in [place], and required an ER visit.  She was treated with antibiotics and steroids with return to baseline.  She denies having a respiratory infection as a trigger, but she did discontinue her Spiriva.   Review of Systems  Constitutional: Negative for fever and unexpected weight change.  HENT: Positive for congestion. Negative for ear pain, nosebleeds, sore throat, rhinorrhea, sneezing, trouble swallowing, dental problem, postnasal drip and sinus pressure.   Eyes: Positive for redness and itching.  Respiratory: Positive for shortness of breath ( had flare up in aug.). Negative for cough, chest tightness and wheezing.   Cardiovascular: Negative for palpitations and leg swelling.  Gastrointestinal: Negative for nausea and vomiting.  Genitourinary: Negative for dysuria.  Musculoskeletal: Negative for joint swelling.  Skin: Negative for rash.  Neurological: Negative for headaches.  Hematological: Bruises/bleeds easily.  Psychiatric/Behavioral: Positive for dysphoric mood. The patient is nervous/anxious.        Objective:   Physical Exam Thin female in no acute distress Nose without purulence or discharge noted Chest with diminished breath sounds throughout, no wheezes or rhonchi Cardiac exam with regular rate and rhythm Lower extremities without edema, no cyanosis Alert and oriented, moves all 4 extremities.       Assessment & Plan:

## 2011-10-17 NOTE — Patient Instructions (Addendum)
Stay on current medications, although could try one more time to come of spiriva if you wish. Continue exercise program. followup with me in 6mos, but call if having issues.

## 2011-10-29 ENCOUNTER — Other Ambulatory Visit: Payer: Self-pay | Admitting: Pulmonary Disease

## 2011-11-08 ENCOUNTER — Ambulatory Visit
Admission: RE | Admit: 2011-11-08 | Discharge: 2011-11-08 | Disposition: A | Discharge status: Home / Self Care | Payer: 59 | Source: Ambulatory Visit | Attending: Obstetrics and Gynecology | Admitting: Obstetrics and Gynecology

## 2011-11-08 DIAGNOSIS — Z1231 Encounter for screening mammogram for malignant neoplasm of breast: Secondary | ICD-10-CM

## 2012-01-10 ENCOUNTER — Other Ambulatory Visit: Payer: Self-pay | Admitting: Pulmonary Disease

## 2012-01-10 MED ORDER — ALBUTEROL SULFATE HFA 108 (90 BASE) MCG/ACT IN AERS: 2.0000 | INHALATION_SPRAY | Freq: Three times a day (TID) | RESPIRATORY_TRACT | Status: DC | PRN

## 2012-03-19 ENCOUNTER — Other Ambulatory Visit (HOSPITAL_COMMUNITY): Payer: Self-pay | Admitting: Family Medicine

## 2012-03-19 ENCOUNTER — Ambulatory Visit (HOSPITAL_COMMUNITY)
Admission: RE | Admit: 2012-03-19 | Discharge: 2012-03-19 | Disposition: A | Discharge status: Home / Self Care | Payer: 59 | Source: Ambulatory Visit | Attending: Family Medicine | Admitting: Family Medicine

## 2012-03-19 DIAGNOSIS — R0602 Shortness of breath: Secondary | ICD-10-CM | POA: Insufficient documentation

## 2012-03-19 DIAGNOSIS — R0789 Other chest pain: Secondary | ICD-10-CM | POA: Insufficient documentation

## 2012-03-19 DIAGNOSIS — M5144 Schmorl's nodes, thoracic region: Secondary | ICD-10-CM | POA: Insufficient documentation

## 2012-03-19 DIAGNOSIS — R079 Chest pain, unspecified: Secondary | ICD-10-CM

## 2012-03-19 MED ORDER — IOHEXOL 300 MG/ML  SOLN
100.0000 mL | Freq: Once | INTRAMUSCULAR | Status: AC | PRN
  Administered 2012-03-19: 100 mL via INTRAVENOUS

## 2012-04-16 ENCOUNTER — Encounter: Payer: Self-pay | Admitting: Pulmonary Disease

## 2012-04-16 ENCOUNTER — Ambulatory Visit (INDEPENDENT_AMBULATORY_CARE_PROVIDER_SITE_OTHER): Payer: 59 | Admitting: Pulmonary Disease

## 2012-04-16 VITALS — BP 146/84 | HR 96 | Temp 99.2°F | Ht 59.5 in | Wt 117.0 lb

## 2012-04-16 DIAGNOSIS — J449 Chronic obstructive pulmonary disease, unspecified: Secondary | ICD-10-CM

## 2012-04-16 NOTE — Patient Instructions (Addendum)
No change in medications Stay as active as possible. followup with me in 6mos.  

## 2012-04-16 NOTE — Assessment & Plan Note (Signed)
The patient is doing well from a pulmonary standpoint, and I have asked her to continue her current bronchodilator regimen.  She is to call me if she has any worsening symptoms, and is to followup with me in 6 months.

## 2012-04-16 NOTE — Progress Notes (Signed)
  Subjective:    Patient ID: Amber Chaney, female    DOB: [birth year], 62 y.o.   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  She is staying compliant on her bronchodilator regimen and has done very well over the last 6 months.  She has not had an acute exacerbation or a chest infection.  She is staying active, and feels that her exertional tolerance is at baseline.   Review of Systems  Constitutional: Negative for fever and unexpected weight change.  HENT: Positive for sinus pressure. Negative for ear pain, nosebleeds, congestion, sore throat, rhinorrhea, sneezing, trouble swallowing, dental problem and postnasal drip.   Eyes: Negative for redness and itching.  Respiratory: Positive for shortness of breath. Negative for cough, chest tightness and wheezing.   Cardiovascular: Negative for palpitations and leg swelling.  Gastrointestinal: Negative for nausea and vomiting.  Genitourinary: Negative for dysuria.  Musculoskeletal: Negative for joint swelling.  Skin: Negative for rash.  Neurological: Negative for headaches.  Hematological: Does not bruise/bleed easily.  Psychiatric/Behavioral: Negative for dysphoric mood. The patient is not nervous/anxious.        Objective:   Physical Exam Wd female in nad Nose without purulence or discharge Neck without LN or TMG Chest with decreased bs, no wheezing  Cor with rrr LE without edema, no cyanosis Alert and oriented, moves all 4.        Assessment & Plan:

## 2012-07-04 ENCOUNTER — Other Ambulatory Visit: Payer: Self-pay | Admitting: Pulmonary Disease

## 2012-07-11 ENCOUNTER — Telehealth: Payer: Self-pay | Admitting: Pulmonary Disease

## 2012-07-11 MED ORDER — MOMETASONE FURO-FORMOTEROL FUM 100-5 MCG/ACT IN AERO: 2.0000 | INHALATION_SPRAY | Freq: Two times a day (BID) | RESPIRATORY_TRACT | Status: DC

## 2012-07-11 NOTE — Telephone Encounter (Signed)
Would change to dulera 100/5  2 bid, rinse mouth

## 2012-07-11 NOTE — Telephone Encounter (Signed)
Rx has been sent in. Pt is aware. 

## 2012-07-11 NOTE — Telephone Encounter (Signed)
Pt dropped off paper work in regards to her insurance coverage. Symbicort is no longer covered under her pharmacy benefit. Pt requesting change in medication. Covered meds include: Asmanex, Alvesco, QVAR, Advair Diskus, Advair HFA, Breo and Dulera.   Please advise Dr Shelle Iron. Thanks.

## 2012-08-20 ENCOUNTER — Other Ambulatory Visit: Payer: Self-pay | Admitting: Pulmonary Disease

## 2012-09-30 ENCOUNTER — Other Ambulatory Visit: Payer: Self-pay | Admitting: Pulmonary Disease

## 2012-10-17 ENCOUNTER — Ambulatory Visit (INDEPENDENT_AMBULATORY_CARE_PROVIDER_SITE_OTHER): Payer: 59 | Admitting: Pulmonary Disease

## 2012-10-17 ENCOUNTER — Encounter: Payer: Self-pay | Admitting: Pulmonary Disease

## 2012-10-17 VITALS — BP 146/82 | HR 90 | Temp 97.3°F | Ht 59.5 in | Wt 114.0 lb

## 2012-10-17 DIAGNOSIS — J438 Other emphysema: Secondary | ICD-10-CM

## 2012-10-17 DIAGNOSIS — J439 Emphysema, unspecified: Secondary | ICD-10-CM

## 2012-10-17 NOTE — Patient Instructions (Addendum)
No change in medications Try and stop smoking 100%. Work on getting exercise at least 4 times a week. followup with me in  6mos.

## 2012-10-17 NOTE — Assessment & Plan Note (Signed)
The patient overall is doing fairly well since her last visit, she has started back smoking a few cigarettes every evening.  I stressed her the importance of total smoking cessation given the severity of her lung disease.  I have also asked her to work on getting some type of exercise on a frequent basis.  Finally, she is to continue on her current bronchodilator regimen.

## 2012-10-17 NOTE — Progress Notes (Signed)
  Subjective:    Patient ID: Amber Chaney, female    DOB: 03/04/1950, 62 y.o.   MRN: [id number]  HPI The patient comes in today for followup of her known COPD.  Overall she has done fairly well, but she has started smoking a few cigarettes a day.  She is staying on her bronchodilator regimen compliantly.  She feels that her breathing is near her baseline, and she denies any significant chest congestion or purulence.   Review of Systems  Constitutional: Negative for fever and unexpected weight change.  HENT: Positive for sneezing. Negative for ear pain, nosebleeds, congestion, sore throat, rhinorrhea, trouble swallowing, dental problem, postnasal drip and sinus pressure.   Eyes: Negative for redness and itching.  Respiratory: Positive for cough and shortness of breath. Negative for chest tightness and wheezing.   Cardiovascular: Negative for palpitations and leg swelling.  Gastrointestinal: Negative for nausea and vomiting.  Genitourinary: Negative for dysuria.  Musculoskeletal: Negative for joint swelling.  Skin: Negative for rash.  Neurological: Negative for headaches.  Hematological: Does not bruise/bleed easily.  Psychiatric/Behavioral: Negative for dysphoric mood. The patient is not nervous/anxious.        Objective:   Physical Exam Well-developed female in no acute distress Nose without purulent discharge noted Neck without lymphadenopathy or thyromegaly Chest with mildly decreased breath sounds, no wheezing Cardiac exam was regular rate and rhythm Lower extremities without edema, no cyanosis Alert and oriented, moves all 4 extremities.       Assessment & Plan:

## 2012-10-31 ENCOUNTER — Other Ambulatory Visit: Payer: Self-pay | Admitting: Pulmonary Disease

## 2012-12-19 ENCOUNTER — Other Ambulatory Visit: Payer: Self-pay

## 2012-12-19 DIAGNOSIS — Z1231 Encounter for screening mammogram for malignant neoplasm of breast: Secondary | ICD-10-CM

## 2013-01-23 ENCOUNTER — Ambulatory Visit
Admission: RE | Admit: 2013-01-23 | Discharge: 2013-01-23 | Disposition: A | Discharge status: Home / Self Care | Payer: 59 | Source: Ambulatory Visit

## 2013-01-23 DIAGNOSIS — Z1231 Encounter for screening mammogram for malignant neoplasm of breast: Secondary | ICD-10-CM

## 2013-02-03 ENCOUNTER — Ambulatory Visit (INDEPENDENT_AMBULATORY_CARE_PROVIDER_SITE_OTHER): Payer: 59 | Admitting: Pulmonary Disease

## 2013-02-03 ENCOUNTER — Telehealth: Payer: Self-pay | Admitting: Pulmonary Disease

## 2013-02-03 ENCOUNTER — Encounter (HOSPITAL_BASED_OUTPATIENT_CLINIC_OR_DEPARTMENT_OTHER): Payer: Self-pay | Admitting: Emergency Medicine

## 2013-02-03 ENCOUNTER — Encounter: Payer: Self-pay | Admitting: Pulmonary Disease

## 2013-02-03 ENCOUNTER — Inpatient Hospital Stay (HOSPITAL_BASED_OUTPATIENT_CLINIC_OR_DEPARTMENT_OTHER)
Admission: EM | Admit: 2013-02-03 | Discharge: 2013-02-05 | DRG: 190 | Disposition: A | Discharge status: Home / Self Care | Payer: 59 | Attending: Internal Medicine | Admitting: Internal Medicine

## 2013-02-03 ENCOUNTER — Ambulatory Visit (INDEPENDENT_AMBULATORY_CARE_PROVIDER_SITE_OTHER)
Admission: RE | Admit: 2013-02-03 | Discharge: 2013-02-03 | Disposition: A | Discharge status: Home / Self Care | Payer: 59 | Source: Ambulatory Visit | Attending: Pulmonary Disease | Admitting: Pulmonary Disease

## 2013-02-03 VITALS — BP 142/72 | HR 124 | Temp 98.5°F | Ht 60.0 in | Wt 112.2 lb

## 2013-02-03 DIAGNOSIS — J441 Chronic obstructive pulmonary disease with (acute) exacerbation: Secondary | ICD-10-CM

## 2013-02-03 DIAGNOSIS — Z87891 Personal history of nicotine dependence: Secondary | ICD-10-CM

## 2013-02-03 DIAGNOSIS — J438 Other emphysema: Secondary | ICD-10-CM

## 2013-02-03 DIAGNOSIS — J189 Pneumonia, unspecified organism: Secondary | ICD-10-CM

## 2013-02-03 DIAGNOSIS — Z825 Family history of asthma and other chronic lower respiratory diseases: Secondary | ICD-10-CM

## 2013-02-03 DIAGNOSIS — J439 Emphysema, unspecified: Secondary | ICD-10-CM

## 2013-02-03 DIAGNOSIS — J1100 Influenza due to unidentified influenza virus with unspecified type of pneumonia: Secondary | ICD-10-CM | POA: Diagnosis present

## 2013-02-03 LAB — POCT I-STAT 3, ART BLOOD GAS (G3+)
Bicarbonate: 24.5 mEq/L — ABNORMAL HIGH (ref 20.0–24.0)
O2 Saturation: 97 %
PCO2 ART: 40.2 mmHg (ref 35.0–45.0)
PH ART: 7.393 (ref 7.350–7.450)
PO2 ART: 89 mmHg (ref 80.0–100.0)
Patient temperature: 98.8
TCO2: 26 mmol/L (ref 0–100)

## 2013-02-03 LAB — COMPREHENSIVE METABOLIC PANEL
ALBUMIN: 4.2 g/dL (ref 3.5–5.2)
ALT: 15 U/L (ref 0–35)
AST: 31 U/L (ref 0–37)
Alkaline Phosphatase: 86 U/L (ref 39–117)
BILIRUBIN TOTAL: 0.2 mg/dL — AB (ref 0.3–1.2)
BUN: 8 mg/dL (ref 6–23)
CALCIUM: 9.6 mg/dL (ref 8.4–10.5)
CHLORIDE: 93 meq/L — AB (ref 96–112)
CO2: 25 mEq/L (ref 19–32)
CREATININE: 0.8 mg/dL (ref 0.50–1.10)
GFR calc Af Amer: 90 mL/min — ABNORMAL LOW (ref >90)
GFR, EST NON AFRICAN AMERICAN: 77 mL/min — AB (ref >90)
Glucose, Bld: 123 mg/dL — ABNORMAL HIGH (ref 70–99)
Potassium: 4.8 mEq/L (ref 3.7–5.3)
Sodium: 132 mEq/L — ABNORMAL LOW (ref 137–147)
Total Protein: 7.6 g/dL (ref 6.0–8.3)

## 2013-02-03 LAB — CBC WITH DIFFERENTIAL/PLATELET
Basophils Absolute: 0 10*3/uL (ref 0.0–0.1)
Basophils Relative: 0 % (ref 0–1)
Eosinophils Absolute: 0 10*3/uL (ref 0.0–0.7)
Eosinophils Relative: 0 % (ref 0–5)
HCT: 38.9 % (ref 36.0–46.0)
HEMOGLOBIN: 13.3 g/dL (ref 12.0–15.0)
Lymphocytes Relative: 6 % — ABNORMAL LOW (ref 12–46)
Lymphs Abs: 0.5 10*3/uL — ABNORMAL LOW (ref 0.7–4.0)
MCH: 33.8 pg (ref 26.0–34.0)
MCHC: 34.2 g/dL (ref 30.0–36.0)
MCV: 99 fL (ref 78.0–100.0)
MONO ABS: 1 10*3/uL (ref 0.1–1.0)
MONOS PCT: 11 % (ref 3–12)
NEUTROS ABS: 7.1 10*3/uL (ref 1.7–7.7)
Neutrophils Relative %: 83 % — ABNORMAL HIGH (ref 43–77)
Platelets: 199 10*3/uL (ref 150–400)
RBC: 3.93 MIL/uL (ref 3.87–5.11)
RDW: 11.9 % (ref 11.5–15.5)
WBC: 8.6 10*3/uL (ref 4.0–10.5)

## 2013-02-03 MED ORDER — METHYLPREDNISOLONE ACETATE 80 MG/ML IJ SUSP
120.0000 mg | Freq: Once | INTRAMUSCULAR | Status: AC
  Administered 2013-02-03: 120 mg via INTRAMUSCULAR

## 2013-02-03 MED ORDER — FORMOTEROL FUMARATE 20 MCG/2ML IN NEBU
20.0000 ug | INHALATION_SOLUTION | Freq: Once | RESPIRATORY_TRACT | Status: AC
  Administered 2013-02-03: 20 ug via RESPIRATORY_TRACT

## 2013-02-03 MED ORDER — ALBUTEROL (5 MG/ML) CONTINUOUS INHALATION SOLN
10.0000 mg/h | INHALATION_SOLUTION | RESPIRATORY_TRACT | Status: DC
Start: 2013-02-03 — End: 2013-02-04
  Administered 2013-02-03: 10 mg/h via RESPIRATORY_TRACT
  Filled 2013-02-03: qty 20

## 2013-02-03 MED ORDER — MAGNESIUM SULFATE 40 MG/ML IJ SOLN
2.0000 g | Freq: Once | INTRAMUSCULAR | Status: AC
  Administered 2013-02-03: 2 g via INTRAVENOUS
  Filled 2013-02-03: qty 50

## 2013-02-03 MED ORDER — LORAZEPAM 2 MG/ML IJ SOLN
1.0000 mg | Freq: Once | INTRAMUSCULAR | Status: AC
  Administered 2013-02-03: 1 mg via INTRAVENOUS
  Filled 2013-02-03: qty 1

## 2013-02-03 MED ORDER — LEVOFLOXACIN 750 MG PO TABS: 750.0000 mg | ORAL_TABLET | Freq: Every day | ORAL | Status: DC

## 2013-02-03 MED ORDER — OSELTAMIVIR PHOSPHATE 75 MG PO CAPS: 75.0000 mg | ORAL_CAPSULE | Freq: Two times a day (BID) | ORAL | Status: DC

## 2013-02-03 NOTE — Assessment & Plan Note (Signed)
The patient's chest x-ray shows no definite infiltrate, but I remained concerned about bacterial pneumonia. She is more likely to have pneumonia than the flu given her underlying lung disease, and therefore will treat her with a course of antibiotics. However, because of her underlying lung disease, I will also treat her empirically for the flu.  We do not have rapid diagnostic testing available to [place] in the office currently.

## 2013-02-03 NOTE — Assessment & Plan Note (Addendum)
The patient is clearly having a COPD exacerbation, with quiet breath sounds and wheezing.  She will need aggressive treatment with a Depo-Medrol injection and also a prednisone taper as an outpatient. She has adequate oxygen saturations, but I am concerned about her mild increased work of breathing and mild tachycardia. We are going to try and keep her out of the hospital for now, but she understands that she must call or go to the emergency room if she is worsening. I have explained to her that she has very poor pulmonary reserve, and can get into trouble quickly.

## 2013-02-03 NOTE — Progress Notes (Signed)
   Subjective:    Patient ID: Amber Chaney, female    DOB: 1950-09-28, 63 y.o.   MRN: [id number]  HPI The patient comes in today for an acute sick visit. She has known COPD, and was in her usual state of health and doing well until 2 days ago. She began to develop chills, followed by fever to 101/102. She did develop some rhinorrhea and a mild sore throat, but no myalgias or arthralgias. She has developed significant chest tightness and increased shortness of breath. She has a very tight cough and is not able to produce mucus.   Review of Systems  Constitutional: Positive for fever and chills. Negative for unexpected weight change.  HENT: Positive for congestion, postnasal drip, rhinorrhea and sneezing. Negative for dental problem, ear pain, nosebleeds, sinus pressure, sore throat and trouble swallowing.   Eyes: Negative for redness and itching.  Respiratory: Positive for cough, chest tightness, shortness of breath and wheezing.   Cardiovascular: Positive for chest pain. Negative for palpitations and leg swelling.  Gastrointestinal: Negative for nausea and vomiting.  Genitourinary: Negative for dysuria.  Musculoskeletal: Negative for joint swelling.  Skin: Negative for rash.  Neurological: Negative for headaches.  Hematological: Does not bruise/bleed easily.  Psychiatric/Behavioral: Negative for dysphoric mood. The patient is not nervous/anxious.        Objective:   Physical Exam Well-developed female in mild respiratory discomfort. Nose without purulence or discharge noted Neck without lymphadenopathy or thyromegaly Chest with very diminished breath sounds with a few wheezes and left lower lobe inspiratory pop. Cardiac exam was tachycardic but regular rhythm Lower extremities without edema, no cyanosis Alert and oriented, moves all 4 extremities.       Assessment & Plan:

## 2013-02-03 NOTE — Telephone Encounter (Signed)
Spoke with pt. She is scheduled to come in and see Pike this afternoon at 3. Nothing further needed

## 2013-02-03 NOTE — ED Provider Notes (Signed)
CSN: [id number]     Arrival date & time 02/03/13  2007 History   First MD Initiated Contact with Patient 02/03/13 2044     Chief Complaint  Patient presents with  . Fever  . Shortness of Breath   (Consider location/radiation/quality/duration/timing/severity/associated sxs/prior Treatment) Patient is a 63 y.o. female presenting with fever and shortness of breath. The history is provided by the patient. No language interpreter was used.  Fever Max temp prior to arrival:  75 Temp source:  Oral Severity:  Severe Onset quality:  Gradual Timing:  Constant Progression:  Worsening Chronicity:  New Relieved by:  Nothing Worsened by:  Nothing tried Ineffective treatments:  None tried Associated symptoms: congestion and cough   Shortness of Breath Severity:  Severe Onset quality:  Gradual Timing:  Constant Progression:  Worsening Chronicity:  New Relieved by:  Nothing Worsened by:  Nothing tried Associated symptoms: cough and fever   Pt seen by Dr. Gwenette Greet today.   Pt given albuterol neb, solumedrol IM.tamiflu and levaquin.   Pt com  Past Medical History  Diagnosis Date  . Allergic rhinitis   . COPD (chronic obstructive pulmonary disease)    Past Surgical History  Procedure Laterality Date  . Breast surgery    . Appendectomy    . 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 Tonsillectomy     Family History  Problem Relation Age of Onset  . Allergies Mother   . Asthma Mother    History  Substance Use Topics  . Smoking status: Former Smoker -- 0.25 packs/day for 44 years    Types: Cigarettes    Quit date: 11/15/2012  . Smokeless tobacco: Not on file     Comment: Started smoking at age 70.    Marland Kitchen Alcohol Use: Yes   OB History   Grav Para Term Preterm Abortions TAB SAB Ect Mult Living                 Review of Systems  Constitutional: Positive for fever.  HENT: Positive for congestion.   Respiratory: Positive for cough and shortness of breath.   All other  systems reviewed and are negative.    Allergies  A-g pro and Eggs or egg-derived products  Home Medications   Current Outpatient Rx  Name  Route  Sig  Dispense  Refill  . albuterol (PROVENTIL) (2.5 MG/3ML) 0.083% nebulizer solution   Nebulization   Take 2.5 mg by nebulization every 6 (six) hours as needed.          . Chlorphen-Pseudoephed-APAP (CORICIDIN D PO)   Oral   Take by mouth daily.         . fluticasone (FLONASE) 50 MCG/ACT nasal spray   Nasal   Place 1 spray into the nose daily.          Marland Kitchen ibandronate (BONIVA) 150 MG tablet   Oral   Take 150 mg by mouth every 30 (thirty) days. Take in the morning with a full glass of water, on an empty stomach, and do not take anything else by mouth or lie down for the next 30 min.          Marland Kitchen levofloxacin (LEVAQUIN) 750 MG tablet   Oral   Take 1 tablet (750 mg total) by mouth daily.   7 tablet   0   . mometasone-formoterol (DULERA) 100-5 MCG/ACT AERO   Inhalation   Inhale 2 puffs into the lungs 2 (two) times  daily.   1 Inhaler   5   . Omega-3 Fatty Acids (FISH OIL PO)   Oral   Take 1 capsule by mouth daily.         Marland Kitchen oseltamivir (TAMIFLU) 75 MG capsule   Oral   Take 1 capsule (75 mg total) by mouth 2 (two) times daily.   10 capsule   0   . SPIRIVA HANDIHALER 18 MCG inhalation capsule      INHALE THE CONTENTS OF 1 CAPSULE DAILY   30 capsule   6   . VENTOLIN HFA 108 (90 BASE) MCG/ACT inhaler      INHALE 2 PUFFS INTO THE LUNGS EVERY 8 HOURS AS NEEDED FOR WHEEZING OR SHORTNESS OF BREATH   1 Inhaler   6   . Vitamin D, Ergocalciferol, (DRISDOL) 50000 UNITS CAPS   Oral   Take 50,000 Units by mouth every 7 (seven) days.            BP 175/89  Pulse 127  Temp(Src) 98.4 F (36.9 C) (Oral)  Resp 22  Ht 5' (1.524 m)  Wt 112 lb (50.803 kg)  BMI 21.87 kg/m2  SpO2 95% Physical Exam  Nursing note and vitals reviewed. Constitutional: She appears well-developed and well-nourished.  HENT:  Head:  Normocephalic.  Right Ear: External ear normal.  Left Ear: External ear normal.  Nose: Nose normal.  Mouth/Throat: Oropharynx is clear and moist.  Eyes: Conjunctivae and EOM are normal. Pupils are equal, round, and reactive to light.  Neck: Normal range of motion. Neck supple.  Cardiovascular:  tachycardia  Pulmonary/Chest: She has wheezes.  Abdominal: Soft.  Musculoskeletal: Normal range of motion.  Neurological: She is alert.  Skin: Skin is warm.  Psychiatric: She has a normal mood and affect.    ED Course  Procedures (including critical care time) Labs Review Labs Reviewed - No data to display Imaging Review Dg Chest 2 View  02/03/2013   CLINICAL DATA:  Chest tightness and shortness of breath.  EXAM: CHEST  2 VIEW  COMPARISON:  CT 03/19/2012 and chest radiograph 05/07/2011  FINDINGS: Two views of the chest demonstrate bilateral breast implants. Again noted is mild hyperinflation without focal airspace disease. Heart and mediastinum are within normal limits. The trachea is midline.  IMPRESSION: Hyperinflation without acute chest findings.   Electronically Signed   By: Markus Daft M.D.   On: 02/03/2013 16:30    EKG Interpretation   None       MDM   1. COPD with acute exacerbation   2. CAP (community acquired pneumonia)      Dr. Stark Jock in to see.  Pt on continous neb,   Pt given ativan and I will start mag sulfate.      Fransico Meadow, PA-C 02/04/13 [place], PA-C 02/04/13 1626

## 2013-02-03 NOTE — ED Notes (Signed)
Sob, fever, cough, chest tightness. She was seen by her MD today and got steroid injection, 3 breathing treatments, and started on Levaquin and Tamaflu. She has had one dose.

## 2013-02-03 NOTE — Patient Instructions (Signed)
Will give you a steroid shot that will start working immediately, and will also start on prednisone taper for your breathing.  You can use your albuterol nebulizer every 4-6 hrs if needed while you are sick. Will start on levaquin 750mg  one every day for 7 days Will start on tamiflu as well at 75mg  am and pm for 5 days.   Please call if you are not improving, and go to ER if worsening during the night.

## 2013-02-03 NOTE — Addendum Note (Signed)
Addended by: Virl Cagey on: 02/03/2013 04:46 PM   Modules accepted: Orders

## 2013-02-03 NOTE — ED Notes (Addendum)
No old EKG found, Pt unable to lay down

## 2013-02-04 ENCOUNTER — Encounter (HOSPITAL_COMMUNITY): Payer: Self-pay | Admitting: *Deleted

## 2013-02-04 DIAGNOSIS — J441 Chronic obstructive pulmonary disease with (acute) exacerbation: Principal | ICD-10-CM

## 2013-02-04 DIAGNOSIS — J189 Pneumonia, unspecified organism: Secondary | ICD-10-CM

## 2013-02-04 LAB — BASIC METABOLIC PANEL
BUN: 7 mg/dL (ref 6–23)
CO2: 21 mEq/L (ref 19–32)
Calcium: 8.8 mg/dL (ref 8.4–10.5)
Chloride: 95 mEq/L — ABNORMAL LOW (ref 96–112)
Creatinine, Ser: 0.78 mg/dL (ref 0.50–1.10)
GFR calc Af Amer: >90 mL/min (ref >90)
GFR, EST NON AFRICAN AMERICAN: 88 mL/min — AB (ref >90)
Glucose, Bld: 122 mg/dL — ABNORMAL HIGH (ref 70–99)
Potassium: 4.6 mEq/L (ref 3.7–5.3)
SODIUM: 131 meq/L — AB (ref 137–147)

## 2013-02-04 LAB — CBC WITH DIFFERENTIAL/PLATELET
BASOS PCT: 1 % (ref 0–1)
Basophils Absolute: 0 10*3/uL (ref 0.0–0.1)
EOS PCT: 1 % (ref 0–5)
Eosinophils Absolute: 0 10*3/uL (ref 0.0–0.7)
HEMATOCRIT: 36.1 % (ref 36.0–46.0)
Hemoglobin: 12.4 g/dL (ref 12.0–15.0)
LYMPHS PCT: 9 % — AB (ref 12–46)
Lymphs Abs: 0.5 10*3/uL — ABNORMAL LOW (ref 0.7–4.0)
MCH: 34.3 pg — ABNORMAL HIGH (ref 26.0–34.0)
MCHC: 34.3 g/dL (ref 30.0–36.0)
MCV: 99.7 fL (ref 78.0–100.0)
MONO ABS: 0.9 10*3/uL (ref 0.1–1.0)
Monocytes Relative: 17 % — ABNORMAL HIGH (ref 3–12)
Neutro Abs: 3.7 10*3/uL (ref 1.7–7.7)
Neutrophils Relative %: 73 % (ref 43–77)
PLATELETS: 177 10*3/uL (ref 150–400)
RBC: 3.62 MIL/uL — ABNORMAL LOW (ref 3.87–5.11)
RDW: 12.6 % (ref 11.5–15.5)
WBC: 5.1 10*3/uL (ref 4.0–10.5)

## 2013-02-04 LAB — PRO B NATRIURETIC PEPTIDE: PRO B NATRI PEPTIDE: 487.5 pg/mL — AB (ref 0–125)

## 2013-02-04 LAB — INFLUENZA PANEL BY PCR (TYPE A & B)
H1N1 flu by pcr: NOT DETECTED
INFLAPCR: POSITIVE — AB
Influenza B By PCR: NEGATIVE

## 2013-02-04 LAB — TSH: TSH: 2.086 u[IU]/mL (ref 0.350–4.500)

## 2013-02-04 MED ORDER — IPRATROPIUM-ALBUTEROL 0.5-2.5 (3) MG/3ML IN SOLN
3.0000 mL | Freq: Three times a day (TID) | RESPIRATORY_TRACT | Status: DC
  Administered 2013-02-05: 3 mL via RESPIRATORY_TRACT
  Filled 2013-02-04: qty 3

## 2013-02-04 MED ORDER — OMEGA-3-ACID ETHYL ESTERS 1 G PO CAPS
1.0000 g | ORAL_CAPSULE | Freq: Every day | ORAL | Status: DC
  Administered 2013-02-04 – 2013-02-05 (×2): 1 g via ORAL
  Filled 2013-02-04 (×2): qty 1

## 2013-02-04 MED ORDER — BUDESONIDE 0.25 MG/2ML IN SUSP
0.2500 mg | Freq: Two times a day (BID) | RESPIRATORY_TRACT | Status: DC
  Administered 2013-02-04 – 2013-02-05 (×3): 0.25 mg via RESPIRATORY_TRACT
  Filled 2013-02-04 (×5): qty 2

## 2013-02-04 MED ORDER — ONDANSETRON HCL 4 MG PO TABS: 4.0000 mg | ORAL_TABLET | Freq: Four times a day (QID) | ORAL | Status: DC | PRN

## 2013-02-04 MED ORDER — ENOXAPARIN SODIUM 40 MG/0.4ML ~~LOC~~ SOLN
40.0000 mg | SUBCUTANEOUS | Status: DC
  Filled 2013-02-04 (×2): qty 0.4

## 2013-02-04 MED ORDER — SODIUM CHLORIDE 0.9 % IJ SOLN
3.0000 mL | Freq: Two times a day (BID) | INTRAMUSCULAR | Status: DC
  Administered 2013-02-04 (×2): 3 mL via INTRAVENOUS

## 2013-02-04 MED ORDER — ONDANSETRON HCL 4 MG/2ML IJ SOLN: 4.0000 mg | Freq: Four times a day (QID) | INTRAMUSCULAR | Status: DC | PRN

## 2013-02-04 MED ORDER — FLUTICASONE PROPIONATE 50 MCG/ACT NA SUSP
1.0000 | Freq: Every day | NASAL | Status: DC
  Administered 2013-02-04 – 2013-02-05 (×2): 1 via NASAL
  Filled 2013-02-04: qty 16

## 2013-02-04 MED ORDER — VITAMIN D (ERGOCALCIFEROL) 1.25 MG (50000 UNIT) PO CAPS
50000.0000 [IU] | ORAL_CAPSULE | ORAL | Status: DC
Start: 2013-02-05 — End: 2013-02-05
  Filled 2013-02-04: qty 1

## 2013-02-04 MED ORDER — ALBUTEROL SULFATE (2.5 MG/3ML) 0.083% IN NEBU
2.5000 mg | INHALATION_SOLUTION | RESPIRATORY_TRACT | Status: DC
  Administered 2013-02-04: 2.5 mg via RESPIRATORY_TRACT
  Filled 2013-02-04: qty 3

## 2013-02-04 MED ORDER — OSELTAMIVIR PHOSPHATE 30 MG PO CAPS
30.0000 mg | ORAL_CAPSULE | Freq: Two times a day (BID) | ORAL | Status: DC
  Administered 2013-02-04 – 2013-02-05 (×3): 30 mg via ORAL
  Filled 2013-02-04 (×4): qty 1

## 2013-02-04 MED ORDER — ACETAMINOPHEN 650 MG RE SUPP: 650.0000 mg | Freq: Four times a day (QID) | RECTAL | Status: DC | PRN

## 2013-02-04 MED ORDER — ACETAMINOPHEN 325 MG PO TABS: 650.0000 mg | ORAL_TABLET | Freq: Four times a day (QID) | ORAL | Status: DC | PRN

## 2013-02-04 MED ORDER — ALBUTEROL SULFATE (2.5 MG/3ML) 0.083% IN NEBU: 2.5000 mg | INHALATION_SOLUTION | RESPIRATORY_TRACT | Status: DC | PRN

## 2013-02-04 MED ORDER — SODIUM CHLORIDE 0.9 % IJ SOLN
3.0000 mL | Freq: Two times a day (BID) | INTRAMUSCULAR | Status: DC
  Administered 2013-02-04: 3 mL via INTRAVENOUS

## 2013-02-04 MED ORDER — IPRATROPIUM BROMIDE 0.02 % IN SOLN
0.5000 mg | RESPIRATORY_TRACT | Status: DC
  Administered 2013-02-04: 0.5 mg via RESPIRATORY_TRACT
  Filled 2013-02-04: qty 2.5

## 2013-02-04 MED ORDER — IPRATROPIUM-ALBUTEROL 0.5-2.5 (3) MG/3ML IN SOLN
3.0000 mL | RESPIRATORY_TRACT | Status: DC
Start: 2013-02-04 — End: 2013-02-04
  Administered 2013-02-04 (×3): 3 mL via RESPIRATORY_TRACT
  Filled 2013-02-04 (×3): qty 3

## 2013-02-04 MED ORDER — PANTOPRAZOLE SODIUM 40 MG PO TBEC
40.0000 mg | DELAYED_RELEASE_TABLET | Freq: Every day | ORAL | Status: DC
  Administered 2013-02-04 – 2013-02-05 (×2): 40 mg via ORAL
  Filled 2013-02-04: qty 1

## 2013-02-04 MED ORDER — LEVOFLOXACIN IN D5W 500 MG/100ML IV SOLN
500.0000 mg | INTRAVENOUS | Status: DC
  Administered 2013-02-04 – 2013-02-05 (×2): 500 mg via INTRAVENOUS
  Filled 2013-02-04 (×2): qty 100

## 2013-02-04 MED ORDER — METHYLPREDNISOLONE SODIUM SUCC 40 MG IJ SOLR
40.0000 mg | Freq: Four times a day (QID) | INTRAMUSCULAR | Status: DC
  Administered 2013-02-04 – 2013-02-05 (×5): 40 mg via INTRAVENOUS
  Filled 2013-02-04 (×9): qty 1

## 2013-02-04 MED ORDER — OSELTAMIVIR PHOSPHATE 75 MG PO CAPS
75.0000 mg | ORAL_CAPSULE | Freq: Two times a day (BID) | ORAL | Status: DC
  Filled 2013-02-04 (×2): qty 1

## 2013-02-04 NOTE — ED Notes (Signed)
Report called to [facility] at [facility]

## 2013-02-04 NOTE — Progress Notes (Signed)
Admitted pt from [facility] [id number]. With labored breathing 3lNC [place]. VSS. Oriented to call bell and room. Paged admitting MD Hal Hope for [place] orders. Maintained NPO as previously ordered.monitor on SR-ST.continued to monitor patient

## 2013-02-04 NOTE — Progress Notes (Signed)
Utilization Review Completed Ruchel Brandenburger J. Javohn Basey, RN, BSN, NCM [phone]  

## 2013-02-04 NOTE — H&P (Signed)
[facility] History and Physical  Amber Chaney EVO:[id number] DOB: 01-10-1951 DOA: 02/03/2013  Referring physician: ER physician. PCP: [name], PA  Specialists: Dr. Gwenette Greet.  Chief Complaint: Shortness of breath.  HPI: Amber Chaney is a 63 y.o. female with history of COPD had gone to the ER in [place] because of worsening shortness of breath. Patient's symptoms started 3 days ago with shortness of breath and productive cough with subjective feeling of fever and chills. Patient had recorded temperatures of 102F at her house. She had gone to her pulmonologist yesterday and was prescribed Levaquin and Tamiflu for possible pneumonia and placed on steroid taper. Patient had taken the antibiotics despite which patient was still feeling short of breath with minimal exertion so decided to come to the ER. In the ER patient was found to be febrile and wheezing. Patient had an ABG which does not show any carbon dioxide retention. Patient has been admitted for further management. Patient otherwise denies any nausea vomiting abdominal pain diarrhea.   Review of Systems: As presented in the history of presenting illness, rest negative.  Past Medical History  Diagnosis Date  . Allergic rhinitis   . COPD (chronic obstructive pulmonary disease)    Past Surgical History  Procedure Laterality Date  . Breast surgery    . Appendectomy    . 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 Tonsillectomy     Social History:  reports that she quit smoking about 2 months ago. Her smoking use included Cigarettes. She has a 11 pack-year smoking history. She does not have any smokeless tobacco history on file. She reports that she drinks alcohol. She reports that she does not use illicit drugs. Where does patient live home. Can patient participate in ADLs? Yes.  Allergies  Allergen Reactions  . A-G Pro     Shots cause tumors in arms  . Eggs Or Egg-Derived Products      Family History:  Family History  Problem Relation Age of Onset  . Allergies Mother   . Asthma Mother       Prior to Admission medications   Medication Sig Start Date End Date Taking? Authorizing Provider  albuterol (PROVENTIL) (2.5 MG/3ML) 0.083% nebulizer solution Take 2.5 mg by nebulization every 6 (six) hours as needed.     Historical Provider, MD  Chlorphen-Pseudoephed-APAP (CORICIDIN D PO) Take by mouth daily.    Historical Provider, MD  fluticasone (FLONASE) 50 MCG/ACT nasal spray Place 1 spray into the nose daily.     Historical Provider, MD  ibandronate (BONIVA) 150 MG tablet Take 150 mg by mouth every 30 (thirty) days. Take in the morning with a full glass of water, on an empty stomach, and do not take anything else by mouth or lie down for the next 30 min.     Historical Provider, MD  levofloxacin (LEVAQUIN) 750 MG tablet Take 1 tablet (750 mg total) by mouth daily. 02/03/13   Kathee Delton, MD  mometasone-formoterol (DULERA) 100-5 MCG/ACT AERO Inhale 2 puffs into the lungs 2 (two) times daily. 07/11/12   Kathee Delton, MD  Omega-3 Fatty Acids (FISH OIL PO) Take 1 capsule by mouth daily.    Historical Provider, MD  oseltamivir (TAMIFLU) 75 MG capsule Take 1 capsule (75 mg total) by mouth 2 (two) times daily. 02/03/13   Kathee Delton, MD  SPIRIVA HANDIHALER 18 MCG inhalation capsule INHALE THE CONTENTS OF 1 CAPSULE DAILY  09/30/12   Kathee Delton, MD  VENTOLIN HFA 108 (90 BASE) MCG/ACT inhaler INHALE 2 PUFFS INTO THE LUNGS EVERY 8 HOURS AS NEEDED FOR WHEEZING OR SHORTNESS OF BREATH 10/31/12   Kathee Delton, MD  Vitamin D, Ergocalciferol, (DRISDOL) 50000 UNITS CAPS Take 50,000 Units by mouth every 7 (seven) days.      Historical Provider, MD    Physical Exam: Filed Vitals:   02/03/13 2321 02/04/13 0028 02/04/13 0207 02/04/13 0602  BP: 126/71 129/64 134/73 120/65  Pulse: 136 126 118 106  Temp: 98.2 F (36.8 C) 99.5 F (37.5 C) 99.4 F (37.4 C) 100 F (37.8 C)  TempSrc:  Oral  Oral Oral  Resp: 22  18 20   Height:   5' (1.524 m)   Weight:   50.077 kg (110 lb 6.4 oz)   SpO2: 98% 99% 99% 99%     General:  Well developed and nourished.  Eyes: Anicteric no pallor.  ENT: No discharge from ears eyes nose or mouth.  Neck: No mass felt. No JVD appreciated.  Cardiovascular: S1-S2 heard.  Respiratory: Breath sounds are very tight bilaterally.  Abdomen: Soft nontender bowel sounds present.  Skin: No rash.  Musculoskeletal: No edema.  Psychiatric: Appears normal.  Neurologic: Alert. Oriented to time place and person. Moves all extremities.  Labs on Admission:  Basic Metabolic Panel:  Recent Labs Lab 02/03/13 2136  NA 132*  K 4.8  CL 93*  CO2 25  GLUCOSE 123*  BUN 8  CREATININE 0.80  CALCIUM 9.6   Liver Function Tests:  Recent Labs Lab 02/03/13 2136  AST 31  ALT 15  ALKPHOS 86  BILITOT 0.2*  PROT 7.6  ALBUMIN 4.2   No results found for this basename: LIPASE, AMYLASE,  in the last 168 hours No results found for this basename: AMMONIA,  in the last 168 hours CBC:  Recent Labs Lab 02/03/13 2136  WBC 8.6  NEUTROABS 7.1  HGB 13.3  HCT 38.9  MCV 99.0  PLT 199   Cardiac Enzymes: No results found for this basename: CKTOTAL, CKMB, CKMBINDEX, TROPONINI,  in the last 168 hours  BNP (last 3 results) No results found for this basename: PROBNP,  in the last 8760 hours CBG: No results found for this basename: GLUCAP,  in the last 168 hours  Radiological Exams on Admission: Dg Chest 2 View  02/03/2013   CLINICAL DATA:  Chest tightness and shortness of breath.  EXAM: CHEST  2 VIEW  COMPARISON:  CT 03/19/2012 and chest radiograph 05/07/2011  FINDINGS: Two views of the chest demonstrate bilateral breast implants. Again noted is mild hyperinflation without focal airspace disease. Heart and mediastinum are within normal limits. The trachea is midline.  IMPRESSION: Hyperinflation without acute chest findings.   Electronically Signed   By:  Markus Daft M.D.   On: 02/03/2013 16:30    EKG: Independently reviewed. Sinus tachycardia.  Assessment/Plan Principal Problem:   COPD exacerbation Active Problems:   CAP (community acquired pneumonia)   1. COPD exacerbation - I have placed patient Solu-Medrol 40 mg IV every 6 hourly along with Pulmicort and nebulizers. Closely observe in telemetry for now. Since patient has shortness of breath on minimal exertion I have ordered BNP. 2. Possible pneumonia - given patient's fever and productive cough patient has been treated placed on Levaquin and Tamiflu for community-acquired pneumonia and influenza. Check influenza PCR. 3. Past history of tobacco abuse.  I have reviewed patient's old charts and labs.  Code Status: Full code.  Family Communication: None.  Disposition Plan: Admit to inpatient.    Joylyn Duggin N. [facility] Pager [phone].  If 7PM-7AM, please contact night-coverage [email or web address] Password [facility] 02/04/2013, 6:14 AM

## 2013-02-04 NOTE — Care Management Note (Signed)
    Page 1 of 1   02/04/2013     3:41:13 PM   CARE MANAGEMENT NOTE 02/04/2013  Patient:  Amber Chaney, Amber Chaney   Account Number:  [id number]  Date Initiated:  02/04/2013  Documentation initiated by:  Fuller Mandril  Subjective/Objective Assessment:   63 y.o. female with history of COPD had gone to the ER in [place] because of worsening shortness of breath. //Home with spouse     Action/Plan:   Solu-Medrol 40 mg IV every 6 hourly along with Pulmicort and nebulizers. Closely observe in telemetry for now. Since patient has shortness of breath on minimal exertion; ordered BNP.//Home with self care   Anticipated DC Date:  02/07/2013   Anticipated DC Plan:  [place]  CM consult      Choice offered to / List presented to:             Status of service:   Medicare Important Message given?   (If response is "NO", the following Medicare IM given date fields will be blank) Date Medicare IM given:   Date Additional Medicare IM given:    Discharge Disposition:    Per UR Regulation:    If discussed at Long Length of Stay Meetings, dates discussed:    Comments:  02/04/13 [place], RN, BSN, NCM [phone] Contact Information: Coletta Memos [phone]

## 2013-02-04 NOTE — Progress Notes (Signed)
BP 112/65  Pulse 109  Temp(Src) 98.9 F (37.2 C) (Oral)  Resp 18  Ht 5' (1.524 m)  Wt 50.077 kg (110 lb 6.4 oz)  BMI 21.56 kg/m2  SpO2 96% - agree with plan started this am. - IV Steroids, Abx and inhalers.

## 2013-02-04 NOTE — Progress Notes (Signed)
SATURATION QUALIFICATIONS: (This note is used to comply with regulatory documentation for home oxygen)  Patient Saturations on Room Air at Rest = 93%  Patient Saturations on Room Air while Ambulating = 90%  Patient Saturations on 1 Liters of oxygen while Ambulating =92%  Please briefly explain why patient needs home oxygen:

## 2013-02-04 NOTE — Progress Notes (Signed)
63yo female c/o SOB, fever, cough, and chest tightness; had been seen as outpt yesterday where she got steroid injection, three breathing txs, and Rxs for Levaquin and Tamiflu (took one dose of each), now admitted.  Will begin Levaquin 500mg  IV Q24H for CrCl ~50 ml/min and monitor CBC and Cx.  Wynona Neat, PharmD, BCPS 02/04/2013 6:23 AM

## 2013-02-04 NOTE — ED Notes (Signed)
Report given to [facility] with carelink

## 2013-02-05 DIAGNOSIS — J441 Chronic obstructive pulmonary disease with (acute) exacerbation: Secondary | ICD-10-CM

## 2013-02-05 DIAGNOSIS — J189 Pneumonia, unspecified organism: Secondary | ICD-10-CM

## 2013-02-05 LAB — BASIC METABOLIC PANEL
BUN: 12 mg/dL (ref 6–23)
CALCIUM: 9.2 mg/dL (ref 8.4–10.5)
CO2: 22 meq/L (ref 19–32)
CREATININE: 0.81 mg/dL (ref 0.50–1.10)
Chloride: 95 mEq/L — ABNORMAL LOW (ref 96–112)
GFR calc Af Amer: 88 mL/min — ABNORMAL LOW (ref >90)
GFR, EST NON AFRICAN AMERICAN: 76 mL/min — AB (ref >90)
GLUCOSE: 171 mg/dL — AB (ref 70–99)
Potassium: 4.2 mEq/L (ref 3.7–5.3)
Sodium: 131 mEq/L — ABNORMAL LOW (ref 137–147)

## 2013-02-05 LAB — CBC
HCT: 36.3 % (ref 36.0–46.0)
Hemoglobin: 12.5 g/dL (ref 12.0–15.0)
MCH: 34.2 pg — AB (ref 26.0–34.0)
MCHC: 34.4 g/dL (ref 30.0–36.0)
MCV: 99.5 fL (ref 78.0–100.0)
PLATELETS: 205 10*3/uL (ref 150–400)
RBC: 3.65 MIL/uL — ABNORMAL LOW (ref 3.87–5.11)
RDW: 12.5 % (ref 11.5–15.5)
WBC: 4.1 10*3/uL (ref 4.0–10.5)

## 2013-02-05 MED ORDER — OSELTAMIVIR PHOSPHATE 75 MG PO CAPS: 75.0000 mg | ORAL_CAPSULE | Freq: Two times a day (BID) | ORAL | Status: DC

## 2013-02-05 MED ORDER — LEVOFLOXACIN 750 MG PO TABS: 750.0000 mg | ORAL_TABLET | Freq: Every day | ORAL | Status: DC

## 2013-02-05 MED ORDER — PREDNISONE 10 MG PO TABS: ORAL_TABLET | ORAL | Status: DC

## 2013-02-05 NOTE — Discharge Summary (Addendum)
Physician Discharge Summary  Amber Chaney NLZ:[id number] DOB: 01/16/1950 DOA: 02/03/2013  PCP: [name], PA  Admit date: 02/03/2013 Discharge date: 02/05/2013  Time spent: 35 minutes  Recommendations for Outpatient Follow-up:  1. Follow up with PCP in 2 weeks.  Discharge Diagnoses:  Principal Problem:   COPD exacerbation Active Problems:   CAP (community acquired pneumonia)   Discharge Condition: stable  Diet recommendation: regular  Filed Weights   02/03/13 2015 02/04/13 0207 02/05/13 0716  Weight: 50.803 kg (112 lb) 50.077 kg (110 lb 6.4 oz) 49.351 kg (108 lb 12.8 oz)    History of present illness:  63 y.o. female with history of COPD had gone to the ER in [place] because of worsening shortness of breath. Patient's symptoms started 3 days ago with shortness of breath and productive cough with subjective feeling of fever and chills. Patient had recorded temperatures of 102F at her house. She had gone to her pulmonologist yesterday and was prescribed Levaquin and Tamiflu for possible pneumonia and placed on steroid taper. Patient had taken the antibiotics despite which patient was still feeling short of breath with minimal exertion so decided to come to the ER. In the ER patient was found to be febrile and wheezing. Patient had an ABG which does not show any carbon dioxide retention. Patient has been admitted for further management. Patient otherwise denies any nausea vomiting abdominal pain diarrhea.   Hospital Course:  COPD exacerbation:  - Started on Solu-Medrol 40 mg IV with Pulmicort and nebulizers.  - Closely observe in telemetry..  - Ambulated pt. And saturation remained above 90%. - Influenza positive, started on Tamiflu will cont for 4 additional days. - cont steroids tapered as an outpatient.   Procedures: CXR: Hyperinflation without acute chest findings.   Consultations:  none  Discharge Exam: Filed Vitals:   02/05/13 0716  BP: 110/69   Pulse: 86  Temp: 98 F (36.7 C)  Resp: 18    General: A&o x3 Cardiovascular: RRR Respiratory: good air movement CTA B/L  Discharge Instructions      Discharge Orders   Future Appointments Provider Department Dept Phone   04/17/2013 3:00 PM Kathee Delton, MD [facility] [phone]   Future Orders Complete By Expires   Diet - low sodium heart healthy  As directed    Increase activity slowly  As directed        Medication List    STOP taking these medications       levofloxacin 750 MG tablet  Commonly known as:  LEVAQUIN      TAKE these medications       VENTOLIN HFA 108 (90 BASE) MCG/ACT inhaler  Generic drug:  albuterol  Inhale 2 puffs into the lungs every 8 (eight) hours as needed for wheezing or shortness of breath.     albuterol (2.5 MG/3ML) 0.083% nebulizer solution  Commonly known as:  PROVENTIL  Take 2.5 mg by nebulization every 6 (six) hours as needed.     co-enzyme Q-10 30 MG capsule  Take 30 mg by mouth 3 (three) times daily.     fluticasone 50 MCG/ACT nasal spray  Commonly known as:  FLONASE  Place 1 spray into the nose daily.     ibandronate 150 MG tablet  Commonly known as:  BONIVA  Take 150 mg by mouth every 30 (thirty) days. Take in the morning with a full glass of water, on an empty stomach, and do not take anything else by mouth or  lie down for the next 30 min.     mometasone-formoterol 100-5 MCG/ACT Aero  Commonly known as:  DULERA  Inhale 2 puffs into the lungs 2 (two) times daily.     oseltamivir 75 MG capsule  Commonly known as:  TAMIFLU  Take 1 capsule (75 mg total) by mouth 2 (two) times daily.     predniSONE 10 MG tablet  Commonly known as:  DELTASONE  Takes 6 tablets for 1 days, then 5 tablets for 1 days, then 4 tablets for 1 days, then 3 tablets for 1 days, then 2 tabs for 1 days, then 1 tab for 1 days, and then stop.     SPIRIVA HANDIHALER 18 MCG inhalation capsule  Generic drug:  tiotropium  Place 18 mcg into  inhaler and inhale daily.     Vitamin D (Ergocalciferol) 50000 UNITS Caps capsule  Commonly known as:  DRISDOL  Take 50,000 Units by mouth every 7 (seven) days.       Allergies  Allergen Reactions  . A-G Pro     Shots cause tumors in arms  . Eggs Or Egg-Derived Products    Follow-up Information   Follow up with [name], PA.   Specialty:  Physician Assistant   Contact information:   [place] [address] [phone]        The results of significant diagnostics from this hospitalization (including imaging, microbiology, ancillary and laboratory) are listed below for reference.    Significant Diagnostic Studies: Dg Chest 2 View  02/03/2013   CLINICAL DATA:  Chest tightness and shortness of breath.  EXAM: CHEST  2 VIEW  COMPARISON:  CT 03/19/2012 and chest radiograph 05/07/2011  FINDINGS: Two views of the chest demonstrate bilateral breast implants. Again noted is mild hyperinflation without focal airspace disease. Heart and mediastinum are within normal limits. The trachea is midline.  IMPRESSION: Hyperinflation without acute chest findings.   Electronically Signed   By: Markus Daft M.D.   On: 02/03/2013 16:30   Mm Digital Screening W/ Implants  01/26/2013   CLINICAL DATA:  Screening.  EXAM: DIGITAL SCREENING BILATERAL MAMMOGRAM WITH IMPLANTS AND CAD  The patient has retropectoral implants. Standard and implant displaced views were performed.  COMPARISON:  Previous exam(s)  ACR Breast Density Category c: The breasts are heterogeneously dense, which may obscure small masses  FINDINGS: There are no findings suspicious for malignancy. Images were processed with CAD.  IMPRESSION: No mammographic evidence of malignancy. A result letter of this screening mammogram will be mailed directly to the patient.  RECOMMENDATION: Screening mammogram in one year. (Code:SM-B-01Y)  BI-RADS CATEGORY  1:  Negative   Electronically Signed   By: Lillia Mountain M.D.   On: 01/26/2013 09:35     Microbiology: No results found for this or any previous visit (from the past 240 hour(s)).   Labs: Basic Metabolic Panel:  Recent Labs Lab 02/03/13 2136 02/04/13 0743 02/05/13 0332  NA 132* 131* 131*  K 4.8 4.6 4.2  CL 93* 95* 95*  CO2 25 21 22   GLUCOSE 123* 122* 171*  BUN 8 7 12   CREATININE 0.80 0.78 0.81  CALCIUM 9.6 8.8 9.2   Liver Function Tests:  Recent Labs Lab 02/03/13 2136  AST 31  ALT 15  ALKPHOS 86  BILITOT 0.2*  PROT 7.6  ALBUMIN 4.2   No results found for this basename: LIPASE, AMYLASE,  in the last 168 hours No results found for this basename: AMMONIA,  in the last  168 hours CBC:  Recent Labs Lab 02/03/13 2136 02/04/13 0743 02/05/13 0332  WBC 8.6 5.1 4.1  NEUTROABS 7.1 3.7  --   HGB 13.3 12.4 12.5  HCT 38.9 36.1 36.3  MCV 99.0 99.7 99.5  PLT 199 177 205   Cardiac Enzymes: No results found for this basename: CKTOTAL, CKMB, CKMBINDEX, TROPONINI,  in the last 168 hours BNP: BNP (last 3 results)  Recent Labs  02/04/13 0745  PROBNP 487.5*   CBG: No results found for this basename: GLUCAP,  in the last 168 hours     Signed:  Charlynne Cousins  [facility] 02/05/2013, 7:44 AM

## 2013-02-05 NOTE — Progress Notes (Signed)
Pt discharged per w/c with all belongings, prescriptions and d/c info. Has exit care notes on Flu and precautions. Pt aware she has Flu type A. Accompanied by Nurse tech. Driven home via private vehicle per husband. Husband smokes says he will smoke only outside. Pt states she does not smoke. She has Nebs and inhalers at home and verbalized proper time of when to use them. 02 sat check just prior to d/c is 93 % on RA.

## 2013-02-05 NOTE — Progress Notes (Signed)
Went over all d/c info with pt aware of follow up meds and when to take next dose of meds.

## 2013-02-06 NOTE — ED Provider Notes (Signed)
Medical screening examination/treatment/procedure(s) were conducted as a shared visit with non-physician practitioner(s) and myself.  I personally evaluated the patient during the encounter. Patient is a 63 year old female with history of COPD. She presents with complaints of shortness of breath. This is been going on for the past 12 hours and is not having any relief with treatment at home. She reports intermittent fevers, congestion, cough.  On exam, vitals are stable patient is afebrile. Oxygen saturations are 95%. Head is atraumatic normocephalic. Heart is regular rate and rhythm. Lungs are noted to have bilateral expiratory wheezes and limited air movement. Abdomen is benign. Extremities are without edema.  Patient was given multiple nebulizer treatments including an hour long albuterol. She did not respond well and remained tachypneic and continued to wheeze. Arrangements will be made for transfer to [facility] for further workup. Dr.Kakrakandy to accept.  EKG Interpretation    Date/Time:  Tuesday February 03 2013 20:33:25 EST Ventricular Rate:  122 PR Interval:  160 QRS Duration: 70 QT Interval:  306 QTC Calculation: 436 R Axis:   80 Text Interpretation:  Sinus tachycardia Biatrial enlargement Abnormal ECG Confirmed by DELOS  MD, Kishon Garriga ([id number]) on 02/03/2013 11:20:24 PM           CRITICAL CARE Performed by: Veryl Speak Total critical care time: 30 minutes Critical care time was exclusive of separately billable procedures and treating other patients. Critical care was necessary to treat or prevent imminent or life-threatening deterioration. Critical care was time spent personally by me on the following activities: development of treatment plan with patient and/or surrogate as well as nursing, discussions with consultants, evaluation of patient's response to treatment, examination of patient, obtaining history from patient or surrogate, ordering and performing treatments and  interventions, ordering and review of laboratory studies, ordering and review of radiographic studies, pulse oximetry and re-evaluation of patient's condition.    Veryl Speak, MD 02/06/13 [id number]

## 2013-02-09 ENCOUNTER — Telehealth: Payer: Self-pay | Admitting: Pulmonary Disease

## 2013-02-09 NOTE — Telephone Encounter (Signed)
Pt has an appt on 04-17-13. Advised pt to keep this appt and to call in the meantime if anything is needed. [place] Bing, CMA

## 2013-04-16 ENCOUNTER — Ambulatory Visit (INDEPENDENT_AMBULATORY_CARE_PROVIDER_SITE_OTHER): Payer: 59 | Admitting: Pulmonary Disease

## 2013-04-16 ENCOUNTER — Encounter: Payer: Self-pay | Admitting: Pulmonary Disease

## 2013-04-16 VITALS — BP 124/68 | HR 89 | Temp 98.4°F | Ht 60.0 in | Wt 117.0 lb

## 2013-04-16 DIAGNOSIS — J439 Emphysema, unspecified: Secondary | ICD-10-CM

## 2013-04-16 DIAGNOSIS — J189 Pneumonia, unspecified organism: Secondary | ICD-10-CM

## 2013-04-16 DIAGNOSIS — J438 Other emphysema: Secondary | ICD-10-CM

## 2013-04-16 MED ORDER — ALBUTEROL SULFATE (2.5 MG/3ML) 0.083% IN NEBU: 2.5000 mg | INHALATION_SOLUTION | Freq: Four times a day (QID) | RESPIRATORY_TRACT | Status: DC | PRN

## 2013-04-16 NOTE — Assessment & Plan Note (Signed)
The patient feels that she is back to baseline from a breathing standpoint after her hospitalization in January with influenza and acute exacerbation. All of her, she continues to feel deconditioned, and we'll work on her exercise program. I've asked her to continue with her maintenance bronchodilator regimen, and will see her back in 6 months.

## 2013-04-16 NOTE — Addendum Note (Signed)
Addended by: Virl Cagey on: 04/16/2013 11:51 AM   Modules accepted: Orders

## 2013-04-16 NOTE — Patient Instructions (Signed)
Continue on your maintenance breathing medications Stay active and work on conditioning program. followup with me again in 6 mos.

## 2013-04-16 NOTE — Progress Notes (Signed)
   Subjective:    Patient ID: Amber Chaney, female    DOB: [birth year], 63 y.o.   MRN: [id number]  HPI The patient comes in today for followup of her known COPD. She was hospitalized in January of this year with a COPD exacerbation associated with influenza. She has gotten back to baseline from a breathing standpoint, but continues to have some deconditioning. She is staying on her maintenance bronchodilator regimen, and has not smoked.   Review of Systems  Constitutional: Negative for fever and unexpected weight change.  HENT: Positive for congestion, postnasal drip, rhinorrhea and sneezing. Negative for dental problem, ear pain, nosebleeds, sinus pressure, sore throat and trouble swallowing.   Eyes: Negative for redness and itching.  Respiratory: Negative for cough, chest tightness, shortness of breath and wheezing.   Cardiovascular: Negative for palpitations and leg swelling.  Gastrointestinal: Negative for nausea and vomiting.  Genitourinary: Negative for dysuria.  Musculoskeletal: Negative for joint swelling.  Skin: Negative for rash.  Neurological: Negative for headaches.  Hematological: Does not bruise/bleed easily.  Psychiatric/Behavioral: Negative for dysphoric mood. The patient is not nervous/anxious.        Objective:   Physical Exam Well-developed female in no acute distress Nose without purulence or discharge noted Neck without lymphadenopathy or thyromegaly Chest with mildly decreased breath sounds, no wheezing Cardiac exam with regular rate and rhythm Lower extremities with no edema, no cyanosis Alert and oriented, moves all 4 extremities       Assessment & Plan:

## 2013-04-17 ENCOUNTER — Ambulatory Visit: Payer: 59 | Admitting: Pulmonary Disease

## 2013-04-29 ENCOUNTER — Other Ambulatory Visit: Payer: Self-pay | Admitting: Pulmonary Disease

## 2013-07-09 ENCOUNTER — Other Ambulatory Visit: Payer: Self-pay | Admitting: Pulmonary Disease

## 2013-07-20 ENCOUNTER — Other Ambulatory Visit: Payer: Self-pay | Admitting: Pulmonary Disease

## 2013-10-16 ENCOUNTER — Encounter (INDEPENDENT_AMBULATORY_CARE_PROVIDER_SITE_OTHER): Payer: Self-pay

## 2013-10-16 ENCOUNTER — Other Ambulatory Visit: Payer: Self-pay | Admitting: Pulmonary Disease

## 2013-10-16 ENCOUNTER — Ambulatory Visit (INDEPENDENT_AMBULATORY_CARE_PROVIDER_SITE_OTHER): Payer: 59 | Admitting: Pulmonary Disease

## 2013-10-16 ENCOUNTER — Encounter: Payer: Self-pay | Admitting: Pulmonary Disease

## 2013-10-16 VITALS — BP 154/76 | HR 108 | Temp 99.3°F | Ht 60.0 in | Wt 120.0 lb

## 2013-10-16 DIAGNOSIS — J439 Emphysema, unspecified: Secondary | ICD-10-CM

## 2013-10-16 NOTE — Patient Instructions (Signed)
No change in breathing medications except to make sure that you take the dulera 2 puffs TWICE a day. Try taking zyrtec 10mg  at bedtime or chlorpheniramine 4mg  OTC 2 at bedtime for more potency. Try sinus rinses am and pm for a few weeks to see if helps with sinus pressure and secretions.  Can get powder refills from drug store.  followup with me again in 29mos.

## 2013-10-16 NOTE — Assessment & Plan Note (Signed)
The patient is doing fairly well from a pulmonary standpoint, but is having a lot of sinus issues associated with her fall allergies.  I have asked her to continue on her bronchodilator regimen, and to try taking an antihistamine at bedtime rather than during the day.  I have also recommended sinus rinses to see if this will help with secretions and sinus pressure. Finally, I have encouraged her to work aggressively on some type of conditioning program.

## 2013-10-16 NOTE — Progress Notes (Signed)
   Subjective:    Patient ID: Amber Chaney, female    DOB: [birth year], 63 y.o.   MRN: [id number]  HPI Patient comes in today for followup of her known COPD.  She is staying on her bronchodilator regimen compliantly, however is only taking dulera once a day instead of twice. She feels that her breathing is at a reasonable level, and has not had a recent acute exacerbation. She has had  recent sinus issues, and is having trouble with fall allergies. She is having a lot of postnasal drip with throat clearing as well as cough.    Review of Systems  Constitutional: Negative for fever and unexpected weight change.  HENT: Positive for congestion, postnasal drip, sinus pressure and sneezing. Negative for dental problem, ear pain, nosebleeds, rhinorrhea, sore throat and trouble swallowing.   Eyes: Negative for redness and itching.  Respiratory: Positive for cough, chest tightness, shortness of breath and wheezing.   Cardiovascular: Negative for palpitations and leg swelling.  Gastrointestinal: Negative for nausea and vomiting.  Genitourinary: Negative for dysuria.  Musculoskeletal: Negative for joint swelling.  Skin: Negative for rash.  Neurological: Negative for headaches.  Hematological: Does not bruise/bleed easily.  Psychiatric/Behavioral: Negative for dysphoric mood. The patient is not nervous/anxious.        Objective:   Physical Exam Well-developed female in no acute distress Nose without purulence or discharge noted Neck without lymphadenopathy or thyromegaly Chest with decreased breath sounds, but no active wheezing or crackles Cardiac exam with regular rate and rhythm Lower extremities without edema, no cyanosis Alert and oriented, moves all 4 extremities.       Assessment & Plan:

## 2013-11-20 ENCOUNTER — Other Ambulatory Visit: Payer: Self-pay | Admitting: Pulmonary Disease

## 2014-02-08 ENCOUNTER — Other Ambulatory Visit: Payer: Self-pay | Admitting: Pulmonary Disease

## 2014-02-12 ENCOUNTER — Other Ambulatory Visit: Payer: Self-pay | Admitting: Pulmonary Disease

## 2014-03-11 ENCOUNTER — Other Ambulatory Visit: Payer: Self-pay | Admitting: Pulmonary Disease

## 2014-04-08 ENCOUNTER — Other Ambulatory Visit: Payer: Self-pay | Admitting: Pulmonary Disease

## 2014-04-09 ENCOUNTER — Other Ambulatory Visit: Payer: Self-pay | Admitting: Pulmonary Disease

## 2014-04-12 ENCOUNTER — Other Ambulatory Visit: Payer: Self-pay | Admitting: Obstetrics and Gynecology

## 2014-04-12 DIAGNOSIS — M810 Age-related osteoporosis without current pathological fracture: Secondary | ICD-10-CM

## 2014-04-12 DIAGNOSIS — E559 Vitamin D deficiency, unspecified: Secondary | ICD-10-CM

## 2014-04-16 ENCOUNTER — Ambulatory Visit
Admission: RE | Admit: 2014-04-16 | Discharge: 2014-04-16 | Disposition: A | Discharge status: Home / Self Care | Payer: 59 | Source: Ambulatory Visit | Attending: Obstetrics and Gynecology | Admitting: Obstetrics and Gynecology

## 2014-04-16 DIAGNOSIS — E559 Vitamin D deficiency, unspecified: Secondary | ICD-10-CM

## 2014-04-16 DIAGNOSIS — M810 Age-related osteoporosis without current pathological fracture: Secondary | ICD-10-CM

## 2014-04-19 ENCOUNTER — Ambulatory Visit (INDEPENDENT_AMBULATORY_CARE_PROVIDER_SITE_OTHER): Payer: 59 | Admitting: Pulmonary Disease

## 2014-04-19 ENCOUNTER — Encounter: Payer: Self-pay | Admitting: Pulmonary Disease

## 2014-04-19 VITALS — BP 134/66 | HR 104 | Temp 98.3°F | Ht 60.0 in | Wt 116.0 lb

## 2014-04-19 DIAGNOSIS — J438 Other emphysema: Secondary | ICD-10-CM

## 2014-04-19 NOTE — Progress Notes (Signed)
   Subjective:    Patient ID: Amber Chaney, female    DOB: 05-06-1950, 64 y.o.   MRN: [id number]  HPI The patient comes in today for follow-up of her known COPD. She has done very well since the last visit, with no increased symptoms or acute exacerbation. He has been having allergy issues for which she is staying on Claritin-D, and is also asking about a refill on her Flonase. She denies any significant cough or purulence, and feels that her exertional tolerance is at baseline.   Review of Systems  Constitutional: Negative for fever and unexpected weight change.  HENT: Positive for congestion, postnasal drip, rhinorrhea and sinus pressure. Negative for dental problem, ear pain, nosebleeds, sneezing, sore throat and trouble swallowing.   Eyes: Negative for redness and itching.  Respiratory: Positive for cough. Negative for chest tightness, shortness of breath and wheezing.   Cardiovascular: Negative for palpitations and leg swelling.  Gastrointestinal: Negative for nausea and vomiting.  Genitourinary: Negative for dysuria.  Musculoskeletal: Negative for joint swelling.  Skin: Negative for rash.  Neurological: Negative for headaches.  Hematological: Does not bruise/bleed easily.  Psychiatric/Behavioral: Negative for dysphoric mood. The patient is not nervous/anxious.        Objective:   Physical Exam Well-developed female in no acute distress Nose without purulence or discharge noted Neck without lymphadenopathy or thyromegaly Chest with decreased breath sounds, no active wheezes or crackles Cardiac exam with regular rate and rhythm Lower extremities without edema, no cyanosis Alert and oriented, moves all 4 extremities.        Assessment & Plan:

## 2014-04-19 NOTE — Assessment & Plan Note (Signed)
The patient continues to do well from a COPD standpoint, with adequate exertional tolerance and no recent flareup. I've asked her to continue on her current maintenance regimen, and to stay as active as possible. She will also continue on her allergy meds.

## 2014-04-19 NOTE — Patient Instructions (Signed)
Will refill your albuterol neb solution (2 boxes) and flonase Stay on dulera and spiriva followup with me again in 67mos if doing well.

## 2014-04-20 ENCOUNTER — Other Ambulatory Visit: Payer: Self-pay | Admitting: Pulmonary Disease

## 2014-04-20 ENCOUNTER — Emergency Department (HOSPITAL_COMMUNITY)
Admission: EM | Admit: 2014-04-20 | Discharge: 2014-04-21 | Disposition: A | Discharge status: Home / Self Care | Payer: 59 | Attending: Emergency Medicine | Admitting: Emergency Medicine

## 2014-04-20 ENCOUNTER — Encounter (HOSPITAL_COMMUNITY): Payer: Self-pay | Admitting: Emergency Medicine

## 2014-04-20 DIAGNOSIS — Y998 Other external cause status: Secondary | ICD-10-CM | POA: Insufficient documentation

## 2014-04-20 DIAGNOSIS — Z79899 Other long term (current) drug therapy: Secondary | ICD-10-CM | POA: Insufficient documentation

## 2014-04-20 DIAGNOSIS — S0181XA Laceration without foreign body of other part of head, initial encounter: Secondary | ICD-10-CM | POA: Diagnosis not present

## 2014-04-20 DIAGNOSIS — Z23 Encounter for immunization: Secondary | ICD-10-CM | POA: Diagnosis not present

## 2014-04-20 DIAGNOSIS — J449 Chronic obstructive pulmonary disease, unspecified: Secondary | ICD-10-CM | POA: Diagnosis not present

## 2014-04-20 DIAGNOSIS — W109XXA Fall (on) (from) unspecified stairs and steps, initial encounter: Secondary | ICD-10-CM | POA: Diagnosis not present

## 2014-04-20 DIAGNOSIS — S022XXA Fracture of nasal bones, initial encounter for closed fracture: Secondary | ICD-10-CM | POA: Diagnosis not present

## 2014-04-20 DIAGNOSIS — Y9289 Other specified places as the place of occurrence of the external cause: Secondary | ICD-10-CM | POA: Diagnosis not present

## 2014-04-20 DIAGNOSIS — Z87891 Personal history of nicotine dependence: Secondary | ICD-10-CM | POA: Diagnosis not present

## 2014-04-20 DIAGNOSIS — Y9301 Activity, walking, marching and hiking: Secondary | ICD-10-CM | POA: Diagnosis not present

## 2014-04-20 DIAGNOSIS — Z7951 Long term (current) use of inhaled steroids: Secondary | ICD-10-CM | POA: Insufficient documentation

## 2014-04-20 DIAGNOSIS — W19XXXA Unspecified fall, initial encounter: Secondary | ICD-10-CM

## 2014-04-20 MED ORDER — LIDOCAINE-EPINEPHRINE 1 %-1:100000 IJ SOLN
10.0000 mL | Freq: Once | INTRAMUSCULAR | Status: AC
  Administered 2014-04-20: 5 mL via INTRADERMAL
  Filled 2014-04-20: qty 1

## 2014-04-20 MED ORDER — TETANUS-DIPHTH-ACELL PERTUSSIS 5-2.5-18.5 LF-MCG/0.5 IM SUSP
0.5000 mL | Freq: Once | INTRAMUSCULAR | Status: AC
  Administered 2014-04-20: 0.5 mL via INTRAMUSCULAR
  Filled 2014-04-20 (×2): qty 0.5

## 2014-04-20 NOTE — ED Notes (Signed)
Patient stated that she was out jogging and she trips. She hit her head on concrete flower bed edge. Patient has had 4 12oz of beer. Patient has a laceration on left forehead. Patient is not on a blood thinner.

## 2014-04-20 NOTE — ED Notes (Signed)
Bed: TR71 Expected date: 04/20/14 Expected time: 10:27 PM Means of arrival: Ambulance Comments: Fall, head laceration

## 2014-04-20 NOTE — ED Provider Notes (Signed)
CSN: [id number]     Arrival date & time 04/20/14  2240 History   First MD Initiated Contact with Patient 04/20/14 2258     Chief Complaint  Patient presents with  . Head Laceration    Patient stated that she was out jogging and she trips. She hit her head on concrete flower bed edge. Patient has had 4 12oz of beer. Patient has a laceration on left forehead. Patient is not on a blood thinner.     (Consider location/radiation/quality/duration/timing/severity/associated sxs/prior Treatment) HPI Amber Chaney is a 64 y.o. female with past medical history of COPD presenting today after a fall. Patient drank 4-12 ounce beers this evening. She was walking up to her house and fell on the steps. She denies loss of consciousness or injury elsewhere. She only has pain at her laceration site across her forehead.  Patient denies any blood thinner use at home. Tetanus status is unknown. Patient has no further complaints.  10 Systems reviewed and are negative for acute change except as noted in the HPI.    Past Medical History  Diagnosis Date  . Allergic rhinitis   . COPD (chronic obstructive pulmonary disease)    Past Surgical History  Procedure Laterality Date  . Breast surgery    . Appendectomy    . 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 Tonsillectomy     Family History  Problem Relation Age of Onset  . Allergies Mother   . Asthma Mother    History  Substance Use Topics  . Smoking status: Former Smoker -- 0.25 packs/day for 44 years    Types: Cigarettes    Quit date: 11/15/2012  . Smokeless tobacco: Never Used     Comment: Started smoking at age 32.    Marland Kitchen Alcohol Use: 0.0 oz/week    2-3 Cans of beer per week     Comment: per day   OB History    No data available     Review of Systems    Allergies  A-g pro and Eggs or egg-derived products  Home Medications   Prior to Admission medications   Medication Sig Start Date End Date Taking? Authorizing Provider   albuterol (PROVENTIL) (2.5 MG/3ML) 0.083% nebulizer solution Take 3 mLs (2.5 mg total) by nebulization every 6 (six) hours as needed. 04/16/13  Yes Kathee Delton, MD  albuterol (VENTOLIN HFA) 108 (90 BASE) MCG/ACT inhaler Inhale 2 puffs into the lungs every 8 (eight) hours as needed for wheezing or shortness of breath.   Yes Historical Provider, MD  FLUOROPLEX 1 % cream Apply 1 application topically every Monday, Wednesday, and Friday. 03/08/14  Yes Historical Provider, MD  fluticasone (FLONASE) 50 MCG/ACT nasal spray Place 1 spray into the nose daily as needed for allergies.    Yes Historical Provider, MD  loratadine (CLARITIN) 10 MG tablet Take 10 mg by mouth daily.   Yes Historical Provider, MD  mometasone-formoterol (DULERA) 100-5 MCG/ACT AERO INHALE 2 PUFFS INTO THE LUNGS once  DAILY 10/16/13  Yes Kathee Delton, MD  Omega 3 1000 MG CAPS Take 1 capsule by mouth daily.   Yes Historical Provider, MD  SPIRIVA HANDIHALER 18 MCG inhalation capsule INHALE CONTENTS OF 1 CAPSULE VIA HANDIHALER DAILY 04/12/14  Yes Kathee Delton, MD  Vitamin D, Ergocalciferol, (DRISDOL) 50000 UNITS CAPS Take 50,000 Units by mouth every 7 (seven) days.    Yes Historical Provider, MD   BP 163/73 mmHg  Pulse 94  Temp(Src) 98 F (36.7 C) (Oral)  Resp 20  SpO2 96% Physical Exam  Constitutional: She is oriented to person, place, and time. She appears well-developed and well-nourished. No distress.  Clinically intoxicated.  HENT:  Nose: Nose normal.  Mouth/Throat: Oropharynx is clear and moist. No oropharyngeal exudate.  Large 10 cm linear laceration across the forehead. [place] is intact.  Eyes: Conjunctivae and EOM are normal. Pupils are equal, round, and reactive to light. No scleral icterus.  Neck: Normal range of motion. Neck supple. No JVD present. No tracheal deviation present. No thyromegaly present.  Cardiovascular: Normal rate, regular rhythm and normal heart sounds.  Exam reveals no gallop and no friction rub.    No murmur heard. Pulmonary/Chest: Effort normal and breath sounds normal. No respiratory distress. She has no wheezes. She exhibits no tenderness.  Abdominal: Soft. Bowel sounds are normal. She exhibits no distension and no mass. There is no tenderness. There is no rebound and no guarding.  Musculoskeletal: Normal range of motion. She exhibits no edema or tenderness.  Lymphadenopathy:    She has no cervical adenopathy.  Neurological: She is alert and oriented to person, place, and time. No cranial nerve deficit. She exhibits normal muscle tone.  Normal strength and sensation times 4 extremities  Skin: Skin is warm and dry. No rash noted. She is not diaphoretic. No erythema. No pallor.  Nursing note and vitals reviewed.   ED Course  Procedures (including critical care time) Labs Review Labs Reviewed - No data to display  Imaging Review Ct Head Wo Contrast  04/21/2014   CLINICAL DATA:  Patient fell while jogging, struck head on concrete edge of the lower bed. Laceration to the left forehead. Cervical collar.  EXAM: CT HEAD WITHOUT CONTRAST  CT MAXILLOFACIAL WITHOUT CONTRAST  CT CERVICAL SPINE WITHOUT CONTRAST  TECHNIQUE: Multidetector CT imaging of the head, cervical spine, and maxillofacial structures were performed using the standard protocol without intravenous contrast. Multiplanar CT image reconstructions of the cervical spine and maxillofacial structures were also generated.  COMPARISON:  None.  FINDINGS: CT HEAD FINDINGS  Mild cerebral atrophy. No ventricular dilatation. Patchy low-attenuation change in the white matter consistent with small vessel ischemia. No mass effect or midline shift. No abnormal extra-axial fluid collections. Gray-white matter junctions are distinct. Basal cisterns are not effaced. No evidence of acute intracranial hemorrhage. No depressed skull fractures. Mastoid air cells are not opacified. Vascular calcifications. Subcutaneous scalp hematoma and laceration over the  anterior frontal region.  CT MAXILLOFACIAL FINDINGS  Globes and extraocular muscles appear intact and symmetrical. Left superior periorbital soft tissue swelling with soft tissue swelling extending across the bridge of the nose. Subcutaneous gas. Changes are consistent with laceration and hematoma. Opacification of a right ethmoid air cell with mucous thickening in the sphenoid sinuses. No acute air-fluid levels in the paranasal sinuses. Multiple displaced nasal bone fractures are demonstrated, most prominent on the left. Orbital rims, maxillary antral walls, frontal bones, maxilla, zygomatic arches, pterygoid plates, mandibles, and temporomandibular joints appear intact. Erosion and deformity of the left mandibular head consistent with degenerative change in the temporomandibular joint.  CT CERVICAL SPINE FINDINGS  Straightening of the usual cervical lordosis. This may be due to patient positioning but ligamentous injury or muscle spasm could also have this appearance and are not excluded. Degenerative changes in the cervical spine with narrowed interspaces and endplate hypertrophic changes at C4-5, C5-6, and C6-7 levels. Degenerative changes also at C7-T1. No vertebral compression deformities. No prevertebral soft  tissue swelling. Posterior elements appear intact. C1-2 articulation appears intact. Vascular calcifications in the cervical carotid arteries. Emphysematous changes noted in the lung apices.  IMPRESSION: No acute intracranial abnormalities. Chronic atrophy and small vessel ischemic changes. Anterior frontal scalp laceration and hematoma with subcutaneous emphysema.  Frontal and left periorbital soft tissue laceration, hematoma, and emphysema. Multiple displaced nasal bone fractures, greatest on the left. No orbital or facial fractures identified.  Nonspecific straightening of the usual cervical lordosis. Degenerative changes in the cervical spine. No acute displaced fractures identified.   Electronically  Signed   By: Lucienne Capers M.D.   On: 04/21/2014 00:54   Ct Cervical Spine Wo Contrast  04/21/2014   CLINICAL DATA:  Patient fell while jogging, struck head on concrete edge of the lower bed. Laceration to the left forehead. Cervical collar.  EXAM: CT HEAD WITHOUT CONTRAST  CT MAXILLOFACIAL WITHOUT CONTRAST  CT CERVICAL SPINE WITHOUT CONTRAST  TECHNIQUE: Multidetector CT imaging of the head, cervical spine, and maxillofacial structures were performed using the standard protocol without intravenous contrast. Multiplanar CT image reconstructions of the cervical spine and maxillofacial structures were also generated.  COMPARISON:  None.  FINDINGS: CT HEAD FINDINGS  Mild cerebral atrophy. No ventricular dilatation. Patchy low-attenuation change in the white matter consistent with small vessel ischemia. No mass effect or midline shift. No abnormal extra-axial fluid collections. Gray-white matter junctions are distinct. Basal cisterns are not effaced. No evidence of acute intracranial hemorrhage. No depressed skull fractures. Mastoid air cells are not opacified. Vascular calcifications. Subcutaneous scalp hematoma and laceration over the anterior frontal region.  CT MAXILLOFACIAL FINDINGS  Globes and extraocular muscles appear intact and symmetrical. Left superior periorbital soft tissue swelling with soft tissue swelling extending across the bridge of the nose. Subcutaneous gas. Changes are consistent with laceration and hematoma. Opacification of a right ethmoid air cell with mucous thickening in the sphenoid sinuses. No acute air-fluid levels in the paranasal sinuses. Multiple displaced nasal bone fractures are demonstrated, most prominent on the left. Orbital rims, maxillary antral walls, frontal bones, maxilla, zygomatic arches, pterygoid plates, mandibles, and temporomandibular joints appear intact. Erosion and deformity of the left mandibular head consistent with degenerative change in the temporomandibular  joint.  CT CERVICAL SPINE FINDINGS  Straightening of the usual cervical lordosis. This may be due to patient positioning but ligamentous injury or muscle spasm could also have this appearance and are not excluded. Degenerative changes in the cervical spine with narrowed interspaces and endplate hypertrophic changes at C4-5, C5-6, and C6-7 levels. Degenerative changes also at C7-T1. No vertebral compression deformities. No prevertebral soft tissue swelling. Posterior elements appear intact. C1-2 articulation appears intact. Vascular calcifications in the cervical carotid arteries. Emphysematous changes noted in the lung apices.  IMPRESSION: No acute intracranial abnormalities. Chronic atrophy and small vessel ischemic changes. Anterior frontal scalp laceration and hematoma with subcutaneous emphysema.  Frontal and left periorbital soft tissue laceration, hematoma, and emphysema. Multiple displaced nasal bone fractures, greatest on the left. No orbital or facial fractures identified.  Nonspecific straightening of the usual cervical lordosis. Degenerative changes in the cervical spine. No acute displaced fractures identified.   Electronically Signed   By: Lucienne Capers M.D.   On: 04/21/2014 00:54   Ct Maxillofacial Wo Cm  04/21/2014   CLINICAL DATA:  Patient fell while jogging, struck head on concrete edge of the lower bed. Laceration to the left forehead. Cervical collar.  EXAM: CT HEAD WITHOUT CONTRAST  CT MAXILLOFACIAL WITHOUT CONTRAST  CT CERVICAL SPINE  WITHOUT CONTRAST  TECHNIQUE: Multidetector CT imaging of the head, cervical spine, and maxillofacial structures were performed using the standard protocol without intravenous contrast. Multiplanar CT image reconstructions of the cervical spine and maxillofacial structures were also generated.  COMPARISON:  None.  FINDINGS: CT HEAD FINDINGS  Mild cerebral atrophy. No ventricular dilatation. Patchy low-attenuation change in the white matter consistent with small  vessel ischemia. No mass effect or midline shift. No abnormal extra-axial fluid collections. Gray-white matter junctions are distinct. Basal cisterns are not effaced. No evidence of acute intracranial hemorrhage. No depressed skull fractures. Mastoid air cells are not opacified. Vascular calcifications. Subcutaneous scalp hematoma and laceration over the anterior frontal region.  CT MAXILLOFACIAL FINDINGS  Globes and extraocular muscles appear intact and symmetrical. Left superior periorbital soft tissue swelling with soft tissue swelling extending across the bridge of the nose. Subcutaneous gas. Changes are consistent with laceration and hematoma. Opacification of a right ethmoid air cell with mucous thickening in the sphenoid sinuses. No acute air-fluid levels in the paranasal sinuses. Multiple displaced nasal bone fractures are demonstrated, most prominent on the left. Orbital rims, maxillary antral walls, frontal bones, maxilla, zygomatic arches, pterygoid plates, mandibles, and temporomandibular joints appear intact. Erosion and deformity of the left mandibular head consistent with degenerative change in the temporomandibular joint.  CT CERVICAL SPINE FINDINGS  Straightening of the usual cervical lordosis. This may be due to patient positioning but ligamentous injury or muscle spasm could also have this appearance and are not excluded. Degenerative changes in the cervical spine with narrowed interspaces and endplate hypertrophic changes at C4-5, C5-6, and C6-7 levels. Degenerative changes also at C7-T1. No vertebral compression deformities. No prevertebral soft tissue swelling. Posterior elements appear intact. C1-2 articulation appears intact. Vascular calcifications in the cervical carotid arteries. Emphysematous changes noted in the lung apices.  IMPRESSION: No acute intracranial abnormalities. Chronic atrophy and small vessel ischemic changes. Anterior frontal scalp laceration and hematoma with subcutaneous  emphysema.  Frontal and left periorbital soft tissue laceration, hematoma, and emphysema. Multiple displaced nasal bone fractures, greatest on the left. No orbital or facial fractures identified.  Nonspecific straightening of the usual cervical lordosis. Degenerative changes in the cervical spine. No acute displaced fractures identified.   Electronically Signed   By: Lucienne Capers M.D.   On: 04/21/2014 00:54     EKG Interpretation None      MDM   Final diagnoses:  Fall   patient presents for falling after ingesting alcohol this evening. She has large laceration which will require repair. Due to size of the injury will perform CT scan to evaluate for other injury. Tetanus shot was updated.  CT scan reveals multiple nasal bone fractures. Also large laceration. Fever. Note below. Patient's vital signs remain within her normal limits, she is safe for follow-up with ENT within 5 days. Return precautions regarding infection given.  LACERATION REPAIR Performed by: Everlene Balls Authorized byEverlene Balls Consent: Verbal consent obtained. Risks and benefits: risks, benefits and alternatives were discussed Consent given by: patient Patient identity confirmed: provided demographic data Prepped and Draped in normal sterile fashion Wound explored  Laceration Location: forehead  Laceration Length: 10cm  No Foreign Bodies seen or palpated  Anesthesia: local infiltration  Local anesthetic: lidocaine 1% with epinephrine  Anesthetic total: 10 ml  Irrigation method: syringe Amount of cleaning: standard  Skin closure: sutures  Number of sutures: 2 deep chromic gut sutures, 14 superficial 5-0 ethilon sutures  Technique: simple interrupted with deep sutures  Patient tolerance: Patient tolerated  the procedure well with no immediate complications.   Everlene Balls, MD 04/21/14 [phone]

## 2014-04-21 ENCOUNTER — Emergency Department (HOSPITAL_COMMUNITY): Payer: 59

## 2014-04-21 NOTE — Discharge Instructions (Signed)
Facial Laceration Amber Chaney, see ENT within 5 days for follow-up of her facial laceration and nasal bone fractures. Come to emergency department immediately if you suspect infection of the sutured area. Take Tylenol and Motrin as needed for pain. Thank you.  Your CT results are below.   IMPRESSION: No acute intracranial abnormalities. Chronic atrophy and small vessel ischemic changes. Anterior frontal scalp laceration and hematoma with subcutaneous emphysema.  Frontal and left periorbital soft tissue laceration, hematoma, and emphysema. Multiple displaced nasal bone fractures, greatest on the left. No orbital or facial fractures identified.  Nonspecific straightening of the usual cervical lordosis. Degenerative changes in the cervical spine. No acute displaced fractures identified.    A facial laceration is a cut on the face. These injuries can be painful and cause bleeding. Some cuts may need to be closed with stitches (sutures), skin adhesive strips, or wound glue. Cuts usually heal quickly but can leave a scar. It can take 1-2 years for the scar to go away completely. HOME CARE   Only take medicines as told by your doctor.  Follow your doctor's instructions for wound care. For Stitches:  Keep the cut clean and dry.  If you have a bandage (dressing), change it at least once a day. Change the bandage if it gets wet or dirty, or as told by your doctor.  Wash the cut with soap and water 2 times a day. Rinse the cut with water. Pat it dry with a clean towel.  Put a thin layer of medicated cream on the cut as told by your doctor.  You may shower after the first 24 hours. Do not soak the cut in water until the stitches are removed.  Have your stitches removed as told by your doctor.  Do not wear any makeup until a few days after your stitches are removed. For Skin Adhesive Strips:  Keep the cut clean and dry.  Do not get the strips wet. You may take a bath, but be careful to  keep the cut dry.  If the cut gets wet, pat it dry with a clean towel.  The strips will fall off on their own. Do not remove the strips that are still stuck to the cut. For Wound Glue:  You may shower or take baths. Do not soak or scrub the cut. Do not swim. Avoid heavy sweating until the glue falls off on its own. After a shower or bath, pat the cut dry with a clean towel.  Do not put medicine or makeup on your cut until the glue falls off.  If you have a bandage, do not put tape over the glue.  Avoid lots of sunlight or tanning lamps until the glue falls off.  The glue will fall off on its own in 5-10 days. Do not pick at the glue. After Healing: Put sunscreen on the cut for the first year to reduce your scar. GET HELP RIGHT AWAY IF:   Your cut area gets red, painful, or puffy (swollen).  You see a yellowish-white fluid (pus) coming from the cut.  You have chills or a fever. MAKE SURE YOU:   Understand these instructions.  Will watch your condition.  Will get help right away if you are not doing well or get worse. Document Released: 06/20/2007 Document Revised: 10/22/2012 Document Reviewed: 08/14/2012 [facility] Patient Information 2015 [place], [place]. This information is not intended to replace advice given to you by your health care provider. Make sure you discuss any questions  you have with your health care provider. Nasal Fracture A fracture is a break in the bone. A nasal fracture is a broken nose. Minor breaks do not need treatment. Serious breaks may need surgery.  HOME CARE  Put ice on the injured area.  Put ice in a plastic bag.  Place a towel between your skin and the bag.  Leave the ice on for 15-20 minutes, 03-04 times a day.  Only take medicine as told by your doctor.  If your nose bleeds, squeeze your nose shut gently. Sit upright for 10 minutes.  Do not play contact sports for 3 to 4 weeks or as told by your doctor. GET HELP RIGHT AWAY IF:   You  have more pain or severe pain.  You keep having nosebleeds.  The shape of your nose does not return to normal after 5 days.  You have yellowish white fluid (pus) coming from your nose.  Your nose bleeds for over 20 minutes.  Clear fluid drains from your nose.  You have a grape-like puffiness (swelling) on the inside of your nose.  You have trouble moving your eyes.  You keep throwing up (vomiting). MAKE SURE YOU:   Understand these instructions.  Will watch this condition.  Will get help right away if you are not doing well or get worse. Document Released: 10/11/2007 Document Revised: 03/26/2011 Document Reviewed: 04/17/2010 [facility] Patient Information 2015 [place], [place]. This information is not intended to replace advice given to you by your health care provider. Make sure you discuss any questions you have with your health care provider.

## 2014-05-21 ENCOUNTER — Other Ambulatory Visit: Payer: Self-pay | Admitting: Pulmonary Disease

## 2014-06-24 ENCOUNTER — Telehealth: Payer: Self-pay | Admitting: Pulmonary Disease

## 2014-06-24 NOTE — Telephone Encounter (Signed)
Lets have her try breo 100, one inhalation each am.  Make sure she rinses. May have to explain to her how to use.

## 2014-06-24 NOTE — Telephone Encounter (Signed)
Spoke with pt. SPIRIVA is covered, she is referring to [facility]. Ruthe Mannan is not covered by her insurance but Advair and Memory Dance are. Pt will need either one of these sent in.  [facility] - please advise. Thanks.

## 2014-06-24 NOTE — Telephone Encounter (Signed)
Samples of breo left for pick up. Pt aware she needs ot be shown how to use it. Nothing further needed

## 2014-07-21 ENCOUNTER — Telehealth: Payer: Self-pay | Admitting: Pulmonary Disease

## 2014-07-21 MED ORDER — ALBUTEROL SULFATE HFA 108 (90 BASE) MCG/ACT IN AERS: 2.0000 | INHALATION_SPRAY | Freq: Three times a day (TID) | RESPIRATORY_TRACT | Status: DC | PRN

## 2014-07-21 MED ORDER — BUDESONIDE-FORMOTEROL FUMARATE 160-4.5 MCG/ACT IN AERO: 2.0000 | INHALATION_SPRAY | Freq: Two times a day (BID) | RESPIRATORY_TRACT | Status: DC

## 2014-07-21 NOTE — Telephone Encounter (Signed)
Per SN >> switch to Symbicort 160 2 puffs BID  Pt is aware of the medication change. Rx will be sent in along with Ventolin per the pt's request. Nothing further was needed at this time.

## 2014-07-21 NOTE — Telephone Encounter (Signed)
Patient was taking [place] and her insurance company refused to pay for it.  She was given a sample of Breo 100, but she said it is not as effective as the [facility] was. She said that she received a letter from [employer] company saying alternatives that they will pay for and Symbicort is on that list.  She says that she has taken Symbicort in the past and it worked great.  She would like to switch to Symbicort. Wants refill of Ventolin and a prescription for Symbicort sent to [place].  SN - ok for patient to switch back to Symbicort?  Allergies  Allergen Reactions  . A-G Pro     Shots cause tumors in arms  . Eggs Or Egg-Derived Products Other (See Comments)    Just the flu shot (pattient can eat eggs)   Current Outpatient Prescriptions on File Prior to Visit  Medication Sig Dispense Refill  . albuterol (PROVENTIL) (2.5 MG/3ML) 0.083% nebulizer solution USE 1 VIAL VIA NEBULIZER EVERY 6 HOURS AS NEEDED 75 mL 1  . albuterol (VENTOLIN HFA) 108 (90 BASE) MCG/ACT inhaler Inhale 2 puffs into the lungs every 8 (eight) hours as needed for wheezing or shortness of breath.    . DULERA 100-5 MCG/ACT AERO INHALE 2 PUFFS INTO THE LUNGS TWICE DAILY 13 g 3  . flunisolide (NASALIDE) 25 MCG/ACT (0.025%) SOLN INSTILL 1 SPRAY IN EACH NOSTRIL TWICE DAILY 25 mL 1  . FLUOROPLEX 1 % cream Apply 1 application topically every Monday, Wednesday, and Friday.  0  . fluticasone (FLONASE) 50 MCG/ACT nasal spray Place 1 spray into the nose daily as needed for allergies.     Marland Kitchen loratadine (CLARITIN) 10 MG tablet Take 10 mg by mouth daily.    . mometasone-formoterol (DULERA) 100-5 MCG/ACT AERO INHALE 2 PUFFS INTO THE LUNGS once  DAILY    . Omega 3 1000 MG CAPS Take 1 capsule by mouth daily.    Marland Kitchen SPIRIVA HANDIHALER 18 MCG inhalation capsule INHALE THE CONTENTS OF 1 CAPSULE VIA HANDIHALER DAILY 30 capsule 2  . Vitamin D, Ergocalciferol, (DRISDOL) 50000 UNITS CAPS Take 50,000 Units by mouth every 7 (seven) days.       No current facility-administered medications on file prior to visit.

## 2014-08-18 ENCOUNTER — Other Ambulatory Visit: Payer: Self-pay | Admitting: *Deleted

## 2014-08-18 MED ORDER — TIOTROPIUM BROMIDE MONOHYDRATE 18 MCG IN CAPS: 18.0000 ug | ORAL_CAPSULE | Freq: Every day | RESPIRATORY_TRACT | Status: DC

## 2014-10-20 ENCOUNTER — Ambulatory Visit (INDEPENDENT_AMBULATORY_CARE_PROVIDER_SITE_OTHER): Payer: 59 | Admitting: Pulmonary Disease

## 2014-10-20 ENCOUNTER — Encounter: Payer: Self-pay | Admitting: Pulmonary Disease

## 2014-10-20 VITALS — BP 140/82 | HR 103 | Temp 97.5°F | Wt 105.8 lb

## 2014-10-20 DIAGNOSIS — J301 Allergic rhinitis due to pollen: Secondary | ICD-10-CM

## 2014-10-20 DIAGNOSIS — Z87891 Personal history of nicotine dependence: Secondary | ICD-10-CM

## 2014-10-20 DIAGNOSIS — J438 Other emphysema: Secondary | ICD-10-CM

## 2014-10-20 NOTE — Progress Notes (Signed)
Subjective:    Patient ID: Amber Chaney, female    DOB: 07-Mar-1950, 64 y.o.   MRN: [id number]  HPI  ~  October 16, 2013:  56mo ROV w/ KC>       Patient comes in today for followup of her known COPD. She is staying on her bronchodilator regimen compliantly, however is only taking dulera once a day instead of twice. She feels that her breathing is at a reasonable level, and has not had a recent acute exacerbation. She has had recent sinus issues, and is having trouble with fall allergies. She is having a lot of postnasal drip with throat clearing as well as cough.       PLAN>  The patient is doing fairly well from a pulmonary standpoint, but is having a lot of sinus issues associated with her fall allergies. I have asked her to continue on her bronchodilator regimen, and to try taking an antihistamine at bedtime rather than during the day. I have also recommended sinus rinses to see if this will help with secretions and sinus pressure. Finally, I have encouraged her to work aggressively on some type of conditioning program.  ~  April 19, 2014:  51mo ROV w/ KC>        The patient comes in today for follow-up of her known COPD. She has done very well since the last visit, with no increased symptoms or acute exacerbation. He has been having allergy issues for which she is staying on Claritin-D, and is also asking about a refill on her Flonase. She denies any significant cough or purulence, and feels that her exertional tolerance is at baseline.      PLAN>  The patient continues to do well from a COPD standpoint, with adequate exertional tolerance and no recent flareup. I've asked her to continue on her current maintenance regimen, and to stay as active as possible. She will also continue on her allergy meds.   ~  October 20, 2014:  25mo ROV w/ SN>  Her PCP is [name] PA at [facility]...      64 y/o WF prev followed by DrClance w/ COPD>  She is an ex-smoker having started at 72, smoked for  >40 yrs up to 1ppd, and quit at age 5 in Nov2014;  Currently on Symbicort160-2spBid, Spiriva daily, Prn NEBS w/ albut vs ProventilHFA (not often), along w/ Claritin & Nasalide prn in spring & fall allergy seasons;  She notes some nasal congestion/ drainage but has remained active & denies any SOB, CP, palpit, cough/ phlegm, edema, etc...       EXAM shows Afeb, VSS, O2sat=97% on RA at rest;  HEENT- neg, mallampati2;  Chest- sl decr BS at bases but clear w/o w/r/r;  Heart- RR w/o m/r/g;  Abd- soft, neg;  Ext- neg w/o c/c/e;  Neuro- intact...  Last Spirometry 09/26/10>  FVC=2.88 (111%), FEV1=1.71 (81%), %1sec=59, mid-flows reduced at 37% predicted;  This is c/w mod airflow obstruction and GOLD Stage 1-2 COPD...  Last CXR 02/03/13 showed bilat breast implants, norm heart size, hyperinflation of lungs but clear w/o acute changes...   She had CT Angio Chest 03/19/12 showing no pulm emboli, Ao OK, no adenopathy, sm calcif granuloma left apex otherw clear lungs w/ subpleural blebs in upper lobes, calcif breast implants...   Last EKG 02/03/13 showed STachy, rate122, biatrial enlargement, no acute changes... IMP/PLAN>>  Amber Chaney appears stable on her current rx- we discussed f/u CXR & FullPFTs but will put  this off til the spring; ROV in 17mo...    Past Medical History  Diagnosis Date  . Allergic rhinitis   . COPD (chronic obstructive pulmonary disease) [facility])     Past Surgical History  Procedure Laterality Date  . Breast surgery    . Appendectomy    . 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 Tonsillectomy      Outpatient Encounter Prescriptions as of 10/20/2014  Medication Sig  . albuterol (PROVENTIL) (2.5 MG/3ML) 0.083% nebulizer solution USE 1 VIAL VIA NEBULIZER EVERY 6 HOURS AS NEEDED  . albuterol (VENTOLIN HFA) 108 (90 BASE) MCG/ACT inhaler Inhale 2 puffs into the lungs every 8 (eight) hours as needed for wheezing or shortness of breath.  . budesonide-formoterol (SYMBICORT)  160-4.5 MCG/ACT inhaler Inhale 2 puffs into the lungs 2 (two) times daily.  . flunisolide (NASALIDE) 25 MCG/ACT (0.025%) SOLN INSTILL 1 SPRAY IN EACH NOSTRIL TWICE DAILY  . loratadine (CLARITIN) 10 MG tablet Take 10 mg by mouth daily.  . Omega 3 1000 MG CAPS Take 1 capsule by mouth daily.  Marland Kitchen tiotropium (SPIRIVA HANDIHALER) 18 MCG inhalation capsule Place 1 capsule (18 mcg total) into inhaler and inhale daily.  . Vitamin D, Ergocalciferol, (DRISDOL) 50000 UNITS CAPS Take 50,000 Units by mouth every 7 (seven) days.   . DULERA 100-5 MCG/ACT AERO INHALE 2 PUFFS INTO THE LUNGS TWICE DAILY (Patient not taking: Reported on 10/20/2014)  . FLUOROPLEX 1 % cream Apply 1 application topically every Monday, Wednesday, and Friday.  . fluticasone (FLONASE) 50 MCG/ACT nasal spray Place 1 spray into the nose daily as needed for allergies.   . mometasone-formoterol (DULERA) 100-5 MCG/ACT AERO INHALE 2 PUFFS INTO THE LUNGS once  DAILY   No facility-administered encounter medications on file as of 10/20/2014.    Allergies  Allergen Reactions  . A-G Pro     Shots cause tumors in arms  . Eggs Or Egg-Derived Products Other (See Comments)    Just the flu shot (pattient can eat eggs)    Immunization History  Administered Date(s) Administered  . Tdap 04/20/2014    Current Medications, Allergies, Past Medical History, Past Surgical History, Family History, and Social History were reviewed in [employer] record.   Review of Systems  Constitutional: Negative for fever and unexpected weight change.  HENT: Positive for congestion, postnasal drip, rhinorrhea and sinus pressure. Negative for dental problem, ear pain, nosebleeds, sneezing, sore throat and trouble swallowing.   Eyes: Negative for redness and itching.  Respiratory: Positive for cough. Negative for chest tightness, shortness of breath and wheezing.   Cardiovascular: Negative for palpitations and leg swelling.  Gastrointestinal:  Negative for nausea and vomiting.  Genitourinary: Negative for dysuria.  Musculoskeletal: Negative for joint swelling.  Skin: Negative for rash.  Neurological: Negative for headaches.  Hematological: Does not bruise/bleed easily.  Psychiatric/Behavioral: Negative for dysphoric mood. The patient is not nervous/anxious.        Objective:   Physical Exam  Well-developed female in no acute distress Nose without purulence or discharge noted Neck without lymphadenopathy or thyromegaly Chest with decreased breath sounds, no active wheezes or crackles Cardiac exam with regular rate and rhythm Lower extremities without edema, no cyanosis Alert and oriented, moves all 4 extremities.     Assessment & Plan:    IMP >>     COPD, emphysema    Allergic Rhinitis    Ex-smoker  PLAN >>  Chamaine appears stable on her current rx- we discussed f/u CXR & FullPFTs but will put this off til the spring; ROV in 40mo   Patient's Medications  New Prescriptions   No medications on file  Previous Medications   ALBUTEROL (PROVENTIL) (2.5 MG/3ML) 0.083% NEBULIZER SOLUTION    USE 1 VIAL VIA NEBULIZER EVERY 6 HOURS AS NEEDED   ALBUTEROL (VENTOLIN HFA) 108 (90 BASE) MCG/ACT INHALER    Inhale 2 puffs into the lungs every 8 (eight) hours as needed for wheezing or shortness of breath.   BUDESONIDE-FORMOTEROL (SYMBICORT) 160-4.5 MCG/ACT INHALER    Inhale 2 puffs into the lungs 2 (two) times daily.   DULERA 100-5 MCG/ACT AERO    INHALE 2 PUFFS INTO THE LUNGS TWICE DAILY   FLUNISOLIDE (NASALIDE) 25 MCG/ACT (0.025%) SOLN    INSTILL 1 SPRAY IN EACH NOSTRIL TWICE DAILY   FLUOROPLEX 1 % CREAM    Apply 1 application topically every Monday, Wednesday, and Friday.   FLUTICASONE (FLONASE) 50 MCG/ACT NASAL SPRAY    Place 1 spray into the nose daily as needed for allergies.    LORATADINE (CLARITIN) 10 MG TABLET    Take 10 mg by mouth daily.   MOMETASONE-FORMOTEROL (DULERA) 100-5 MCG/ACT AERO    INHALE 2 PUFFS INTO THE  LUNGS once  DAILY   OMEGA 3 1000 MG CAPS    Take 1 capsule by mouth daily.   TIOTROPIUM (SPIRIVA HANDIHALER) 18 MCG INHALATION CAPSULE    Place 1 capsule (18 mcg total) into inhaler and inhale daily.   VITAMIN D, ERGOCALCIFEROL, (DRISDOL) 50000 UNITS CAPS    Take 50,000 Units by mouth every 7 (seven) days.   Modified Medications   No medications on file  Discontinued Medications   No medications on file

## 2014-11-11 ENCOUNTER — Other Ambulatory Visit: Payer: Self-pay | Admitting: Pulmonary Disease

## 2015-02-25 ENCOUNTER — Other Ambulatory Visit: Payer: Self-pay | Admitting: Pulmonary Disease

## 2015-03-03 ENCOUNTER — Telehealth: Payer: Self-pay | Admitting: Pulmonary Disease

## 2015-03-03 MED ORDER — FLUNISOLIDE 25 MCG/ACT (0.025%) NA SOLN: NASAL | Status: DC

## 2015-03-03 NOTE — Telephone Encounter (Signed)
Called spoke with [facility]. Pt needed refill on nasalide. This has been refilled. Nothing further needed

## 2015-04-20 ENCOUNTER — Encounter: Payer: Self-pay | Admitting: Pulmonary Disease

## 2015-04-20 ENCOUNTER — Ambulatory Visit (INDEPENDENT_AMBULATORY_CARE_PROVIDER_SITE_OTHER): Payer: 59 | Admitting: Pulmonary Disease

## 2015-04-20 VITALS — BP 134/72 | HR 105 | Ht 60.0 in | Wt 96.6 lb

## 2015-04-20 DIAGNOSIS — J441 Chronic obstructive pulmonary disease with (acute) exacerbation: Secondary | ICD-10-CM

## 2015-04-20 DIAGNOSIS — Z87891 Personal history of nicotine dependence: Secondary | ICD-10-CM | POA: Diagnosis not present

## 2015-04-20 DIAGNOSIS — J301 Allergic rhinitis due to pollen: Secondary | ICD-10-CM | POA: Diagnosis not present

## 2015-04-20 DIAGNOSIS — J309 Allergic rhinitis, unspecified: Secondary | ICD-10-CM | POA: Insufficient documentation

## 2015-04-20 DIAGNOSIS — J438 Other emphysema: Secondary | ICD-10-CM

## 2015-04-20 MED ORDER — ALBUTEROL SULFATE (2.5 MG/3ML) 0.083% IN NEBU: INHALATION_SOLUTION | RESPIRATORY_TRACT | Status: DC

## 2015-04-20 NOTE — Patient Instructions (Signed)
Today we updated your med list in our EPIC system...    Continue your current medications the same...  We refilled your ALBUTEROL for nebulizer... Continue the Dougherty regularly as you are doing...  Add in the OTC MUCINEX 600mg  tabs- take 2 tabs twice daily w/ plenty of fluids...    This should help to "cut" the phlegm 7 make it easier to cough out...  We will try to get your recent CXR from the Urgent Care at [facility]...  Let's plan a follow up visit her is about 6 weeks for a follow up film...  Call for any questions or if we can be of service in any way.Marland KitchenMarland Kitchen

## 2015-04-20 NOTE — Progress Notes (Signed)
Subjective:    Patient ID: Amber Chaney, female    DOB: 09/30/1950, 65 y.o.   MRN: [id number]  HPI    65 y/o WF, ex-smoker quit in 2014, followed for COPD/ emphysema...  ~  October 16, 2013:  31mo ROV w/ Amber Chaney>       Patient comes in today for followup of her known COPD. She is staying on her bronchodilator regimen compliantly, however is only taking dulera once a day instead of twice. She feels that her breathing is at a reasonable level, and has not had a recent acute exacerbation. She has had recent sinus issues, and is having trouble with fall allergies. She is having a lot of postnasal drip with throat clearing as well as cough.       PLAN>  The patient is doing fairly well from a pulmonary standpoint, but is having a lot of sinus issues associated with her fall allergies. I have asked her to continue on her bronchodilator regimen, and to try taking an antihistamine at bedtime rather than during the day. I have also recommended sinus rinses to see if this will help with secretions and sinus pressure. Finally, I have encouraged her to work aggressively on some type of conditioning program.  ~  April 19, 2014:  65mo ROV w/ Amber Chaney>        The patient comes in today for follow-up of her known COPD. She has done very well since the last visit, with no increased symptoms or acute exacerbation. He has been having allergy issues for which she is staying on Claritin-D, and is also asking about a refill on her Flonase. She denies any significant cough or purulence, and feels that her exertional tolerance is at baseline.      PLAN>  The patient continues to do well from a COPD standpoint, with adequate exertional tolerance and no recent flareup. I've asked her to continue on her current maintenance regimen, and to stay as active as possible. She will also continue on her allergy meds.   ~  October 20, 2014:  80mo ROV w/ SN>  Her PCP is [name] PA at [facility]...      65 y/o WF prev followed by  DrClance w/ COPD>  She is an ex-smoker having started at 65, smoked for >40 yrs up to 1ppd, and quit at age 40 in Nov2014;  Currently on Symbicort160-2spBid, Spiriva daily, Prn NEBS w/ Albut vs ProventilHFA (not often), along w/ Claritin & Nasalide prn in spring & fall allergy seasons;  She notes some nasal congestion/ drainage but has remained active & denies any SOB, CP, palpit, cough/ phlegm, edema, etc...       EXAM shows Afeb, VSS, O2sat=97% on RA at rest;  HEENT- neg, mallampati2;  Chest- sl decr BS at bases but clear w/o w/r/r;  Heart- RR w/o m/r/g;  Abd- soft, neg;  Ext- neg w/o c/c/e;  Neuro- intact...  Last Spirometry 09/26/10>  FVC=2.88 (111%), FEV1=1.71 (81%), %1sec=59, mid-flows reduced at 37% predicted;  This is c/w mod airflow obstruction and GOLD Stage 1-2 COPD...  Last CXR 02/03/13 showed bilat breast implants, norm heart size, hyperinflation of lungs but clear w/o acute changes...   She had CT Angio Chest 03/19/12 showing no pulm emboli, Ao OK, no adenopathy, sm calcif granuloma left apex otherw clear lungs w/ subpleural blebs in upper lobes, calcif breast implants...   Last EKG 02/03/13 showed STachy, rate122, biatrial enlargement, no acute changes... IMP/PLAN>>  Amber Chaney appears  stable on her current rx- we discussed f/u CXR & FullPFTs but will put this off til the spring; ROV in 20mo...  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    AR> on Claritin10 & Nasalide;     COPD/emphysema> on Symbicort160-2spBid, Spiriva daily, NEBS w/ Albut prn    Hx  pneumonia> [facility] was [facility] w/ COPD exac +FluA titer but norm CBC and no infiltrate on CXR; treated w/ Tamiflu, Levaquin, Soulmedrol=>Pred & improved...    Ex-smoker> sahe quit smoking in 2014 w/ a 65-50 pack yr smoking hx.    Medical issues> on Omega-3 Fish Oil, VitD- 50K weekly; she has Osteopenia w/ BMD 4/16 showing Tscore -2.4 in Lspine EXAM shows Afeb, VSS, O2sat=96% on RA at rest;  HEENT- neg, mallampati1;  Chest- sl decr BS at bases but clear w/o w/r/r;  Heart- RR w/ gr1/6SEM no r/g;  Abd- soft, neg;  Ext- neg w/o c/c/e;  Neuro- intact... IMP/PLAN>>  We refilled her Albut for NEB w/ instructions to use it Q6h prn;  Reminded of the importance of MUCINEX 600mg - 2Bid w/ fluids for the thick phlegm;  Finish the Pred taper & return in 6wks for f/u CXR and to sched FullPFTs...     Past Medical History  Diagnosis Date  . Allergic rhinitis   . COPD (chronic obstructive pulmonary disease) [facility])     Past Surgical History  Procedure Laterality Date  . Breast surgery    . Appendectomy    . 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 Tonsillectomy      Outpatient Encounter Prescriptions as of 04/20/2015  Medication Sig  . albuterol (PROVENTIL) (2.5 MG/3ML) 0.083% nebulizer solution USE 1 VIAL VIA NEBULIZER EVERY 6 HOURS AS NEEDED  . albuterol (VENTOLIN HFA) 108 (90 BASE) MCG/ACT inhaler Inhale 2 puffs into the lungs every 8 (eight) hours as needed for wheezing or shortness of breath.  . benzonatate (TESSALON) 200 MG capsule TK 1 C PO TID PRF COUGH  . budesonide-formoterol (SYMBICORT) 160-4.5 MCG/ACT inhaler Inhale 2 puffs into the lungs 2 (two) times daily.  . flunisolide (NASALIDE) 25 MCG/ACT (0.025%) SOLN INSTILL 1 SPRAY IN EACH NOSTRIL TWICE DAILY  . levofloxacin (LEVAQUIN) 750 MG tablet TK 1 T PO QD  . loratadine (CLARITIN) 10 MG tablet Take 10 mg by mouth daily.  . mometasone-formoterol (DULERA) 100-5 MCG/ACT AERO INHALE 2 PUFFS INTO THE LUNGS once  DAILY  . Omega 3 1000  MG CAPS Take 1 capsule by mouth daily.  . predniSONE (STERAPRED UNI-PAK 21 TAB) 5 MG (21) TBPK tablet FPD  . SPIRIVA HANDIHALER 18 MCG inhalation capsule INHALE CONTENTS OF 1 CAPSULE VIA HANDIHALER DAILY  . Vitamin D, Ergocalciferol, (DRISDOL) 50000 UNITS CAPS Take 50,000 Units by mouth every 7 (seven) days.   . [DISCONTINUED] albuterol (PROVENTIL) (2.5 MG/3ML) 0.083% nebulizer solution USE 1 VIAL VIA NEBULIZER EVERY 6 HOURS AS NEEDED  . [DISCONTINUED] DULERA 100-5 MCG/ACT AERO INHALE 2 PUFFS INTO  THE LUNGS TWICE DAILY (Patient not taking: Reported on 04/20/2015)  . [DISCONTINUED] FLUOROPLEX 1 % cream Apply 1 application topically every Monday, Wednesday, and Friday. Reported on 04/20/2015  . [DISCONTINUED] fluticasone (FLONASE) 50 MCG/ACT nasal spray Place 1 spray into the nose daily as needed for allergies. Reported on 04/20/2015   No facility-administered encounter medications on file as of 04/20/2015.    Allergies  Allergen Reactions  . A-G Pro     Shots cause tumors in arms  . Eggs Or Egg-Derived Products Other (See Comments)    Just the flu shot (pattient can eat eggs)    Immunization History  Administered Date(s) Administered  . Tdap 04/20/2014    Current Medications, Allergies, Past Medical History, Past Surgical History, Family History, and Social History were reviewed in [employer] record.   Review of Systems  Constitutional: Negative for fever and unexpected weight change.  HENT: Positive for congestion, postnasal drip, rhinorrhea and sinus pressure. Negative for dental problem, ear pain, nosebleeds, sneezing, sore throat and trouble swallowing.   Eyes: Negative for redness and itching.  Respiratory: Positive for cough. Negative for chest tightness, shortness of breath and wheezing.   Cardiovascular: Negative for palpitations and leg swelling.  Gastrointestinal: Negative for nausea and vomiting.  Genitourinary: Negative for dysuria.  Musculoskeletal:  Negative for joint swelling.  Skin: Negative for rash.  Neurological: Negative for headaches.  Hematological: Does not bruise/bleed easily.  Psychiatric/Behavioral: Negative for dysphoric mood. The patient is not nervous/anxious.        Objective:   Physical Exam  Well-developed female in no acute distress Nose without purulence or discharge noted Neck without lymphadenopathy or thyromegaly Chest with decreased breath sounds, no active wheezes or crackles Cardiac exam with regular rate and rhythm Lower extremities without edema, no cyanosis Alert and oriented, moves all 4 extremities.     Assessment & Plan:    IMP >>     COPD, emphysema    Allergic Rhinitis    Ex-smoker  PLAN >>  10/20/14>   Amber Chaney appears stable on her current rx- we discussed f/u CXR & FullPFTs but will put this off til the spring; ROV in 39mo 04/20/15>   We refilled her Albut for NEB w/ instructions to use it Q6h prn;  Reminded of the importance of MUCINEX 600mg - 2Bid w/ fluids for the thick phlegm;  Finish the Pred taper & return in 6wks for f/u CXR and to sched FullPFTs.  Patient's Medications  New Prescriptions   No medications on file  Previous Medications   ALBUTEROL (VENTOLIN HFA) 108 (90 BASE) MCG/ACT INHALER    Inhale 2 puffs into the lungs every 8 (eight) hours as needed for wheezing or shortness of breath.   BENZONATATE (TESSALON) 200 MG CAPSULE    TK 1 C PO TID PRF COUGH   BUDESONIDE-FORMOTEROL (SYMBICORT) 160-4.5 MCG/ACT INHALER    Inhale 2 puffs into the lungs 2 (two) times daily.   FLUNISOLIDE (NASALIDE) 25 MCG/ACT (0.025%) SOLN    INSTILL 1 SPRAY IN EACH NOSTRIL TWICE DAILY   LEVOFLOXACIN (LEVAQUIN) 750 MG TABLET    TK 1 T PO QD   LORATADINE (CLARITIN) 10 MG TABLET    Take 10 mg by mouth daily.   MOMETASONE-FORMOTEROL (DULERA) 100-5 MCG/ACT AERO    INHALE 2 PUFFS INTO THE LUNGS once  DAILY   OMEGA 3 1000 MG CAPS    Take 1 capsule by mouth daily.   PREDNISONE (STERAPRED UNI-PAK 21 TAB) 5 MG  (21)  TBPK TABLET    FPD   SPIRIVA HANDIHALER 18 MCG INHALATION CAPSULE    INHALE CONTENTS OF 1 CAPSULE VIA HANDIHALER DAILY   VITAMIN D, ERGOCALCIFEROL, (DRISDOL) 50000 UNITS CAPS    Take 50,000 Units by mouth every 7 (seven) days.   Modified Medications   Modified Medication Previous Medication   ALBUTEROL (PROVENTIL) (2.5 MG/3ML) 0.083% NEBULIZER SOLUTION albuterol (PROVENTIL) (2.5 MG/3ML) 0.083% nebulizer solution      USE 1 VIAL VIA NEBULIZER EVERY 6 HOURS AS NEEDED    USE 1 VIAL VIA NEBULIZER EVERY 6 HOURS AS NEEDED  Discontinued Medications   DULERA 100-5 MCG/ACT AERO    INHALE 2 PUFFS INTO THE LUNGS TWICE DAILY   FLUOROPLEX 1 % CREAM    Apply 1 application topically every Monday, Wednesday, and Friday. Reported on 04/20/2015   FLUTICASONE (FLONASE) 50 MCG/ACT NASAL SPRAY    Place 1 spray into the nose daily as needed for allergies. Reported on 04/20/2015

## 2015-05-22 ENCOUNTER — Other Ambulatory Visit: Payer: Self-pay | Admitting: Pulmonary Disease

## 2015-06-09 ENCOUNTER — Ambulatory Visit: Payer: 59 | Admitting: Pulmonary Disease

## 2015-06-11 ENCOUNTER — Other Ambulatory Visit: Payer: Self-pay | Admitting: Pulmonary Disease

## 2015-06-19 ENCOUNTER — Other Ambulatory Visit: Payer: Self-pay | Admitting: Pulmonary Disease

## 2015-06-23 ENCOUNTER — Encounter: Payer: Self-pay | Admitting: Pulmonary Disease

## 2015-06-23 ENCOUNTER — Ambulatory Visit (INDEPENDENT_AMBULATORY_CARE_PROVIDER_SITE_OTHER): Payer: Medicare Other | Admitting: Pulmonary Disease

## 2015-06-23 ENCOUNTER — Ambulatory Visit (INDEPENDENT_AMBULATORY_CARE_PROVIDER_SITE_OTHER)
Admission: RE | Admit: 2015-06-23 | Discharge: 2015-06-23 | Disposition: A | Discharge status: Home / Self Care | Payer: Medicare Other | Source: Ambulatory Visit | Attending: Pulmonary Disease | Admitting: Pulmonary Disease

## 2015-06-23 VITALS — BP 132/68 | HR 76 | Temp 98.0°F | Ht 60.0 in | Wt 98.2 lb

## 2015-06-23 DIAGNOSIS — Z87891 Personal history of nicotine dependence: Secondary | ICD-10-CM

## 2015-06-23 DIAGNOSIS — J438 Other emphysema: Secondary | ICD-10-CM

## 2015-06-23 NOTE — Patient Instructions (Signed)
Today we updated your med list in our EPIC system...    Continue your current medications the same...  Today we did a follow up CXR... We will sched a complete pulmonary function test for you...    We will contact you w/ the results when available...   Keep up the good work w/ your exercise program....  Continue to AVOID infections!  Call for any questions...  Let's plan a follow up visit in 66mo, sooner if needed for problems.Marland KitchenMarland Kitchen

## 2015-06-23 NOTE — Progress Notes (Signed)
Subjective:    Patient ID: Amber [facility], female    DOB: 09/30/1950, 65 y.o.   MRN: [id number]  HPI    65 y/o WF, ex-smoker quit in 2014, followed for COPD/ emphysema...  ~  October 16, 2013:  31mo ROV w/ KC>       Patient comes in today for followup of her known COPD. She is staying on her bronchodilator regimen compliantly, however is only taking dulera once a day instead of twice. She feels that her breathing is at a reasonable level, and has not had a recent acute exacerbation. She has had recent sinus issues, and is having trouble with fall allergies. She is having a lot of postnasal drip with throat clearing as well as cough.       PLAN>  The patient is doing fairly well from a pulmonary standpoint, but is having a lot of sinus issues associated with her fall allergies. I have asked her to continue on her bronchodilator regimen, and to try taking an antihistamine at bedtime rather than during the day. I have also recommended sinus rinses to see if this will help with secretions and sinus pressure. Finally, I have encouraged her to work aggressively on some type of conditioning program.  ~  April 19, 2014:  42mo ROV w/ KC>        The patient comes in today for follow-up of her known COPD. She has done very well since the last visit, with no increased symptoms or acute exacerbation. He has been having allergy issues for which she is staying on Claritin-D, and is also asking about a refill on her Flonase. She denies any significant cough or purulence, and feels that her exertional tolerance is at baseline.      PLAN>  The patient continues to do well from a COPD standpoint, with adequate exertional tolerance and no recent flareup. I've asked her to continue on her current maintenance regimen, and to stay as active as possible. She will also continue on her allergy meds.   ~  October 20, 2014:  80mo ROV w/ SN>  Her PCP is Amber Chimes PA at [facility]...      65 y/o WF prev followed by  DrClance w/ COPD>  She is an ex-smoker having started at 38, smoked for >40 yrs up to 1ppd, and quit at age 40 in Nov2014;  Currently on Symbicort160-2spBid, Spiriva daily, Prn NEBS w/ Albut vs ProventilHFA (not often), along w/ Claritin & Nasalide prn in spring & fall allergy seasons;  She notes some nasal congestion/ drainage but has remained active & denies any SOB, CP, palpit, cough/ phlegm, edema, etc...       EXAM shows Afeb, VSS, O2sat=97% on RA at rest;  HEENT- neg, mallampati2;  Chest- sl decr BS at bases but clear w/o w/r/r;  Heart- RR w/o m/r/g;  Abd- soft, neg;  Ext- neg w/o c/c/e;  Neuro- intact...  Last Spirometry 09/26/10>  FVC=2.88 (111%), FEV1=1.71 (81%), %1sec=59, mid-flows reduced at 37% predicted;  This is c/w mod airflow obstruction and GOLD Stage 1-2 COPD...  Last CXR 02/03/13 showed bilat breast implants, norm heart size, hyperinflation of lungs but clear w/o acute changes...   She had CT Angio Chest 03/19/12 showing no pulm emboli, Ao OK, no adenopathy, sm calcif granuloma left apex otherw clear lungs w/ subpleural blebs in upper lobes, calcif breast implants...   Last EKG 02/03/13 showed STachy, rate122, biatrial enlargement, no acute changes... IMP/PLAN>>  Lowen appears  stable on her current rx- we discussed f/u CXR & FullPFTs but will put this off til the spring; ROV in 20mo...  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    AR> on Claritin10 & Nasalide;     COPD/emphysema> on Symbicort160-2spBid, Spiriva daily, NEBS w/ Albut  prn    Hx pneumonia> she was [facility] w/ COPD exac +FluA titer but norm CBC and no infiltrate on CXR; treated w/ Tamiflu, Levaquin, Soulmedrol=>Pred & improved...    Ex-smoker> sahe quit smoking in 2014 w/ a 40-50 pack yr smoking hx.    Medical issues> on Omega-3 Fish Oil, VitD- 50K weekly; she has Osteopenia w/ BMD 4/16 showing Tscore -2.4 in Lspine EXAM shows Afeb, VSS, O2sat=96% on RA at rest;  HEENT- neg, mallampati1;  Chest- sl decr BS at bases but clear w/o w/r/r;  Heart- RR w/ gr1/6SEM no r/g;  Abd- soft, neg;  Ext- neg w/o c/c/e;  Neuro- intact... IMP/PLAN>>  We refilled her Albut for NEB w/ instructions to use it Q6h prn;  Reminded of the importance of [place] 600mg - 2Bid w/ fluids for the thick phlegm;  Finish the Pred taper & return in 6wks for f/u CXR and to sched FullPFTs...   ~  June 23, 2015:  32mo ROV w/ SN>  Amber Chaney reports feeling much better- she has chronic stable SOB/ DOE, chr AM cough, min sputum- no color/ no blood; she's been walking several days per week; she remains on Symbicort160-2spBid, Spiriva daily, Albut Nebs & HFA as needed; she is no longer taking Mucinex; she also has Claritin/ Nasalide for allergy symptoms... Problem list as noted above...    EXAM shows Afeb, VSS, O2sat=98% on RA at rest;  HEENT- neg, mallampati1;  Chest- sl decr BS at bases but clear w/o w/r/r;  Heart- RR w/ gr1/6SEM no r/g;  Abd- soft, neg;  Ext- neg w/o c/c/e;  Neuro- intact...  CXR 06/23/15>  Normal heart size, hyperinflation suggesting emphysema, clear w/o infiltrates etc, bilat implants... IMP/PLAN>>  Nealy's CXR is back to basely COPD, NAD- we discussed setting up Full PFTs to check lung volumes and DLCO       Past Medical History  Diagnosis Date  . Allergic rhinitis   . COPD (chronic obstructive pulmonary disease) [facility])     Past Surgical History  Procedure Laterality Date  . Breast surgery    . Appendectomy    . 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  Tonsillectomy      Outpatient Encounter Prescriptions as of 06/23/2015  Medication Sig  . albuterol (PROVENTIL) (2.5 MG/3ML) 0.083% nebulizer solution USE 1 VIAL VIA NEBULIZER EVERY 6 HOURS AS NEEDED  . albuterol (VENTOLIN HFA) 108 (90 BASE) MCG/ACT inhaler Inhale 2 puffs into the lungs every 8 (eight) hours as needed for wheezing or shortness of breath.  . flunisolide (NASALIDE) 25 MCG/ACT (0.025%) SOLN INSTILL 1 SPRAY IN EACH NOSTRIL TWICE DAILY  . loratadine (CLARITIN) 10 MG  tablet Take 10 mg by mouth daily.  . Omega 3 1000 MG CAPS Take 1 capsule by mouth daily.  Marland Kitchen SPIRIVA HANDIHALER 18 MCG inhalation capsule INHALE CONTENTS OF 1 CAPSULE VIA HANDIHALER DAILY  . SYMBICORT 160-4.5 MCG/ACT inhaler INHALE 2 PUFFS INTO THE LUNGS TWICE DAILY  . Vitamin D, Ergocalciferol, (DRISDOL) 50000 UNITS CAPS Take 50,000 Units by mouth every 7 (seven) days.   . mometasone-formoterol (DULERA) 100-5 MCG/ACT AERO Reported on 06/23/2015  . [DISCONTINUED] benzonatate (TESSALON) 200 MG capsule TK 1 C PO TID PRF COUGH  . [DISCONTINUED] levofloxacin (LEVAQUIN) 750 MG tablet TK 1 T PO QD  . [DISCONTINUED] predniSONE (STERAPRED UNI-PAK 21 TAB) 5 MG (21) TBPK tablet FPD  . [DISCONTINUED] SPIRIVA HANDIHALER 18 MCG inhalation capsule INHALE CONTENTS OF 1 CAPSULE VIA HANDIHALER DAILY   No facility-administered encounter medications on file as of 06/23/2015.    Allergies  Allergen Reactions  . A-G Pro     Shots cause tumors in arms  . Eggs Or Egg-Derived Products Other (See Comments)    Just the flu shot (pattient can eat eggs)    Immunization History  Administered Date(s) Administered  . Tdap 04/20/2014    Current Medications, Allergies, Past Medical History, Past Surgical History, Family History, and Social History were reviewed in [employer] record.   Review of Systems  Constitutional: Negative for fever and unexpected weight change.  HENT: Positive for congestion, postnasal drip,  rhinorrhea and sinus pressure. Negative for dental problem, ear pain, nosebleeds, sneezing, sore throat and trouble swallowing.   Eyes: Negative for redness and itching.  Respiratory: Positive for cough. Negative for chest tightness, shortness of breath and wheezing.   Cardiovascular: Negative for palpitations and leg swelling.  Gastrointestinal: Negative for nausea and vomiting.  Genitourinary: Negative for dysuria.  Musculoskeletal: Negative for joint swelling.  Skin: Negative for rash.  Neurological: Negative for headaches.  Hematological: Does not bruise/bleed easily.  Psychiatric/Behavioral: Negative for dysphoric mood. The patient is not nervous/anxious.        Objective:   Physical Exam  Well-developed female in no acute distress Nose without purulence or discharge noted Neck without lymphadenopathy or thyromegaly Chest with decreased breath sounds, no active wheezes or crackles Cardiac exam with regular rate and rhythm Lower extremities without edema, no cyanosis Alert and oriented, moves all 4 extremities.     Assessment & Plan:    IMP >>     COPD, emphysema    Allergic Rhinitis    Ex-smoker  PLAN >>  10/20/14>   Loucinda appears stable on her current rx- we discussed f/u CXR & FullPFTs but will put this off til the spring; ROV in 64mo 04/20/15>   We refilled her Albut for NEB w/ instructions to use it Q6h prn;  Reminded of the importance of MUCINEX 600mg - 2Bid w/ fluids for the thick phlegm;  Finish the Pred taper & return in 6wks for f/u CXR and to sched FullPFTs. 06/23/15>   She is back to baseline & CXR clear/ COPD- NAD; rec to continue meds, regular exercise, check FullPFTs...   Patient's Medications  New Prescriptions   No medications on file  Previous Medications   ALBUTEROL (PROVENTIL) (2.5 MG/3ML) 0.083% NEBULIZER SOLUTION    USE 1 VIAL VIA NEBULIZER EVERY 6 HOURS AS NEEDED   ALBUTEROL (VENTOLIN HFA) 108 (90 BASE) MCG/ACT INHALER    Inhale 2 puffs into the  lungs every 8 (eight) hours as needed for wheezing or shortness of breath.  FLUNISOLIDE (NASALIDE) 25 MCG/ACT (0.025%) SOLN    INSTILL 1 SPRAY IN EACH NOSTRIL TWICE DAILY   LORATADINE (CLARITIN) 10 MG TABLET    Take 10 mg by mouth daily.   MOMETASONE-FORMOTEROL (DULERA) 100-5 MCG/ACT AERO    Reported on 06/23/2015   OMEGA 3 1000 MG CAPS    Take 1 capsule by mouth daily.   SPIRIVA HANDIHALER 18 MCG INHALATION CAPSULE    INHALE CONTENTS OF 1 CAPSULE VIA HANDIHALER DAILY   SYMBICORT 160-4.5 MCG/ACT INHALER    INHALE 2 PUFFS INTO THE LUNGS TWICE DAILY   VITAMIN D, ERGOCALCIFEROL, (DRISDOL) 50000 UNITS CAPS    Take 50,000 Units by mouth every 7 (seven) days.   Modified Medications   No medications on file  Discontinued Medications   BENZONATATE (TESSALON) 200 MG CAPSULE    TK 1 C PO TID PRF COUGH   LEVOFLOXACIN (LEVAQUIN) 750 MG TABLET    TK 1 T PO QD   PREDNISONE (STERAPRED UNI-PAK 21 TAB) 5 MG (21) TBPK TABLET    FPD

## 2015-06-28 NOTE — Progress Notes (Signed)
Quick Note:  Called spoke with patient, advised of cxr results / recs as stated by SN. Pt verbalized her understanding and denied any questions. ______

## 2015-09-01 ENCOUNTER — Telehealth: Payer: Self-pay | Admitting: Pulmonary Disease

## 2015-09-01 NOTE — Telephone Encounter (Signed)
According to letter proair HFA, proair respiclick will be covered at tier 2. LMTCB x1 for pt to advise which pharmacy to send this too for pt

## 2015-09-02 MED ORDER — ALBUTEROL SULFATE HFA 108 (90 BASE) MCG/ACT IN AERS: 1.0000 | INHALATION_SPRAY | Freq: Four times a day (QID) | RESPIRATORY_TRACT | 11 refills | Status: DC | PRN

## 2015-09-02 NOTE — Telephone Encounter (Signed)
Called and spoke with pt and she is aware of proair being sent in to her pharmacy. Nothing further is needed.

## 2015-10-14 ENCOUNTER — Other Ambulatory Visit: Payer: Self-pay | Admitting: Pulmonary Disease

## 2015-11-14 ENCOUNTER — Other Ambulatory Visit: Payer: Self-pay | Admitting: Pulmonary Disease

## 2015-12-26 ENCOUNTER — Other Ambulatory Visit: Payer: Self-pay | Admitting: Pulmonary Disease

## 2015-12-26 ENCOUNTER — Ambulatory Visit (INDEPENDENT_AMBULATORY_CARE_PROVIDER_SITE_OTHER): Payer: Medicare Other | Admitting: Pulmonary Disease

## 2015-12-26 ENCOUNTER — Encounter (INDEPENDENT_AMBULATORY_CARE_PROVIDER_SITE_OTHER): Payer: Medicare Other | Admitting: Pulmonary Disease

## 2015-12-26 ENCOUNTER — Encounter: Payer: Self-pay | Admitting: Pulmonary Disease

## 2015-12-26 VITALS — BP 144/80 | HR 101 | Temp 98.4°F | Ht 60.5 in | Wt 97.6 lb

## 2015-12-26 DIAGNOSIS — Z87891 Personal history of nicotine dependence: Secondary | ICD-10-CM

## 2015-12-26 DIAGNOSIS — J438 Other emphysema: Secondary | ICD-10-CM | POA: Diagnosis not present

## 2015-12-26 DIAGNOSIS — I10 Essential (primary) hypertension: Secondary | ICD-10-CM

## 2015-12-26 DIAGNOSIS — J439 Emphysema, unspecified: Secondary | ICD-10-CM

## 2015-12-26 LAB — PULMONARY FUNCTION TEST
DL/VA % PRED: 75 %
DL/VA: 3.27 ml/min/mmHg/L
DLCO COR % PRED: 74 %
DLCO COR: 14.64 ml/min/mmHg
DLCO UNC % PRED: 75 %
DLCO unc: 14.82 ml/min/mmHg
FEF 25-75 PRE: 0.42 L/s
FEF 25-75 Post: 0.52 L/sec
FEF2575-%CHANGE-POST: 24 %
FEF2575-%Pred-Post: 27 %
FEF2575-%Pred-Pre: 22 %
FEV1-%Change-Post: 14 %
FEV1-%PRED-PRE: 47 %
FEV1-%Pred-Post: 55 %
FEV1-POST: 1.15 L
FEV1-Pre: 1 L
FEV1FVC-%CHANGE-POST: 5 %
FEV1FVC-%Pred-Pre: 60 %
FEV6-%CHANGE-POST: 6 %
FEV6-%PRED-POST: 86 %
FEV6-%PRED-PRE: 80 %
FEV6-POST: 2.25 L
FEV6-PRE: 2.11 L
FEV6FVC-%CHANGE-POST: -1 %
FEV6FVC-%PRED-POST: 100 %
FEV6FVC-%PRED-PRE: 102 %
FVC-%Change-Post: 8 %
FVC-%Pred-Post: 85 %
FVC-%Pred-Pre: 78 %
FVC-Post: 2.33 L
FVC-Pre: 2.15 L
POST FEV6/FVC RATIO: 97 %
Post FEV1/FVC ratio: 49 %
Pre FEV1/FVC ratio: 46 %
Pre FEV6/FVC Ratio: 98 %

## 2015-12-26 MED ORDER — LOSARTAN POTASSIUM 50 MG PO TABS: 50.0000 mg | ORAL_TABLET | Freq: Every day | ORAL | 3 refills | Status: AC

## 2015-12-26 NOTE — Progress Notes (Signed)
Subjective:    Patient ID: Amber Chaney, female    DOB: 01/10/1951, 65 y.o.   MRN: [id number]  HPI    65 y/o WF, ex-smoker quit in 2014, followed for COPD/ emphysema...  ~  October 16, 2013:  65mo ROV w/ KC>       Patient comes in today for followup of her known COPD. She is staying on her bronchodilator regimen compliantly, however is only taking dulera once a day instead of twice. She feels that her breathing is at a reasonable level, and has not had a recent acute exacerbation. She has had recent sinus issues, and is having trouble with fall allergies. She is having a lot of postnasal drip with throat clearing as well as cough.       PLAN>  The patient is doing fairly well from a pulmonary standpoint, but is having a lot of sinus issues associated with her fall allergies. I have asked her to continue on her bronchodilator regimen, and to try taking an antihistamine at bedtime rather than during the day. I have also recommended sinus rinses to see if this will help with secretions and sinus pressure. Finally, I have encouraged her to work aggressively on some type of conditioning program.  ~  April 19, 2014:  65mo ROV w/ KC>        The patient comes in today for follow-up of her known COPD. She has done very well since the last visit, with no increased symptoms or acute exacerbation. He has been having allergy issues for which she is staying on Claritin-D, and is also asking about a refill on her Flonase. She denies any significant cough or purulence, and feels that her exertional tolerance is at baseline.      PLAN>  The patient continues to do well from a COPD standpoint, with adequate exertional tolerance and no recent flareup. I've asked her to continue on her current maintenance regimen, and to stay as active as possible. She will also continue on her allergy meds.   ~  October 20, 2014:  65mo ROV w/ SN>  Her PCP is [name] PA at [facility]...      65 y/o WF prev followed by  DrClance w/ COPD>  She is an ex-smoker having started at 36, smoked for >40 yrs up to 1ppd, and quit at age 61 in Nov2014;  Currently on Symbicort160-2spBid, Spiriva daily, Prn NEBS w/ Albut vs ProventilHFA (not often), along w/ Claritin & Nasalide prn in spring & fall allergy seasons;  She notes some nasal congestion/ drainage but has remained active & denies any SOB, CP, palpit, cough/ phlegm, edema, etc...       EXAM shows Afeb, VSS, O2sat=97% on RA at rest;  HEENT- neg, mallampati2;  Chest- sl decr BS at bases but clear w/o w/r/r;  Heart- RR w/o m/r/g;  Abd- soft, neg;  Ext- neg w/o c/c/e;  Neuro- intact...  Last Spirometry 09/26/10>  FVC=2.88 (111%), FEV1=1.71 (81%), %1sec=59, mid-flows reduced at 37% predicted;  This is c/w mod airflow obstruction and GOLD Stage 1-2 COPD...  Last CXR 02/03/13 showed bilat breast implants, norm heart size, hyperinflation of lungs but clear w/o acute changes...   She had CT Angio Chest 03/19/12 showing no pulm emboli, Ao OK, no adenopathy, sm calcif granuloma left apex otherw clear lungs w/ subpleural blebs in upper lobes, calcif breast implants...   Last EKG 02/03/13 showed STachy, rate122, biatrial enlargement, no acute changes... IMP/PLAN>>  Amber Chaney appears  stable on her current rx- we discussed f/u CXR & FullPFTs but will put this off til the spring; ROV in 70mo...  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    AR> on Claritin10 & Nasalide;     COPD/emphysema> on Symbicort160-2spBid, Spiriva daily, NEBS w/ Albut  prn    Hx pneumonia> she was [facility] w/ COPD exac +FluA titer but norm CBC and no infiltrate on CXR; treated w/ Tamiflu, Levaquin, Soulmedrol=>Pred & improved...    Ex-smoker> sahe quit smoking in 2014 w/ a 40-50 pack yr smoking hx.    Medical issues> on Omega-3 Fish Oil, VitD- 50K weekly; she has Osteopenia w/ BMD 4/16 showing Tscore -2.4 in Lspine EXAM shows Afeb, VSS, O2sat=96% on RA at rest;  HEENT- neg, mallampati1;  Chest- sl decr BS at bases but clear w/o w/r/r;  Heart- RR w/ gr1/6SEM no r/g;  Abd- soft, neg;  Ext- neg w/o c/c/e;  Neuro- intact... IMP/PLAN>>  We refilled her Albut for NEB w/ instructions to use it Q6h prn;  Reminded of the importance of [place] 600mg - 2Bid w/ fluids for the thick phlegm;  Finish the Pred taper & return in 6wks for f/u CXR and to sched FullPFTs...   ~  June 23, 2015:  65mo ROV w/ SN>  Amber Chaney reports feeling much better- she has chronic stable SOB/ DOE, chr AM cough, min sputum- no color/ no blood; she's been walking several days per week; she remains on Symbicort160-2spBid, Spiriva daily, Albut Nebs & HFA as needed; she is no longer taking Mucinex; she also has Claritin/ Nasalide for allergy symptoms... Problem list as noted above...    EXAM shows Afeb, VSS, O2sat=98% on RA at rest;  HEENT- neg, mallampati1;  Chest- sl decr BS at bases but clear w/o w/r/r;  Heart- RR w/ gr1/6SEM no r/g;  Abd- soft, neg;  Ext- neg w/o c/c/e;  Neuro- intact...  CXR 06/23/15>  Normal heart size, hyperinflation suggesting emphysema, clear w/o infiltrates etc, bilat implants... IMP/PLAN>>  Amber Chaney's CXR is back to basely COPD, NAD- we discussed setting up Full PFTs to check lung volumes and DLCO  ~  December 26, 2015:  65mo ROV w/ FullPFTs today> Amber Chaney indicates that her breathing is stable, no change overall; however she has developed a "tickle" in her throat and of interest she indicates that she has been Dx w. HBP by her new PCP- [place] [facility], NP-- placed on  Lisinopril & amlodipine;  I discussed w/ pt how the Lisinopril can coause this tickle & a cough 7 we decided to change to [facility] & she will f/u w/ her PCP... We reviewed the following medical problems during today's office visit >>     AR> on Claritin10 & Nasalide as needed...    COPD/emphysema> on Symbicort160-2spBid, Spiriva daily, NEBS w/ Albut vs HFA prn    Hx pneumonia> she was [facility] w/ COPD exac +FluA titer but norm CBC and no infiltrate on CXR; treated w/ Tamiflu,  Levaquin, Soulmedrol=>Pred & improved...    Ex-smoker> she quit smoking in 2014 w/ a 40-50 pack yr smoking hx.    Medical issues> recent Dx of HBP- started on Amlod5, Lisin10; on Omega-3 Fish Oil, VitD- 50K weekly; she has Osteopenia w/ BMD 4/16 showing Tscore -2.4 in Lspine; she developed Lisin side effect & we changed Lisin10 to [id number] & she will f/u w/ PCP... EXAM shows Afeb, VSS w/ BP=144/80, O2sat=97% on RA at rest;  HEENT- neg, mallampati1;  Chest- sl decr BS at bases but clear w/o w/r/r;  Heart- RR w/ gr1/6SEM no r/g;  Abd- soft, neg;  Ext- neg w/o c/c/e;  Neuro- intact...  FullPFTs 12/26/15>  FVC=2.15 (78%), FEV1=1.00 (47%), %1sec=46, mid-flows reduced at 22% predicted;  FEV1 improved to 1.15 (15%) after bronchodil;  TLC=4.31 (95%), RV=1.79 (93%), RV/TLC=42% and DLCO=75% predicted...  This shows a severe obstructive ventilatory defect w/ some emphysema and a small reversible component... IMP/PLAN>>  Amber Chaney has severe COPD w/ and emphysematous component AND a reversible component;  REC to continue regular dosing of Symbicort160-2spBid, Spiriva daily, and a regular exercise program;  We are stopping her Lisinopril in favor of LOSARTAN50 & she will f/u w/ her PCP... We plan rov recheck in 62mo.    Past Medical History:  Diagnosis Date  . Allergic rhinitis   . COPD (chronic obstructive pulmonary disease) ([place])     Past Surgical History:  Procedure Laterality Date  . APPENDECTOMY    . BREAST SURGERY    .  TONSILLECTOMY    . 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 albuterol (PROAIR HFA) 108 (90 Base) MCG/ACT inhaler Inhale 1-2 puffs into the lungs every 6 (six) hours as needed for wheezing or shortness of breath.  Marland Kitchen albuterol (PROVENTIL) (2.5 MG/3ML) 0.083% nebulizer solution USE 1 VIAL VIA NEBULIZER EVERY 6 HOURS AS NEEDED  . amLODipine (NORVASC) 5 MG tablet Take 1 tablet by mouth daily.  . flunisolide (NASALIDE) 25 MCG/ACT (0.025%) SOLN INSTILL 1 SPRAY IN EACH NOSTRIL TWICE DAILY  . lisinopril (PRINIVIL,ZESTRIL) 10 MG tablet Take 10 mg by mouth daily.  Marland Kitchen loratadine (CLARITIN) 10 MG tablet Take 10 mg by mouth daily.  . Omega 3 1000 MG CAPS Take 1 capsule by mouth daily.  Marland Kitchen SPIRIVA HANDIHALER 18 MCG inhalation capsule INHALE CONTENTS OF 1 CAPSULE VIA HANDIHALER DAILY  . SYMBICORT 160-4.5 MCG/ACT inhaler INHALE 2 PUFFS INTO THE LUNGS TWICE DAILY  . Vitamin D, Ergocalciferol, (DRISDOL) 50000 UNITS CAPS Take 50,000 Units by mouth every 7 (seven) days.   Marland Kitchen losartan (COZAAR) 50 MG tablet Take 1 tablet (50 mg total) by mouth daily.  . [DISCONTINUED] mometasone-formoterol (DULERA) 100-5 MCG/ACT AERO Reported on 06/23/2015   No facility-administered encounter medications on file as of 12/26/2015.     Allergies  Allergen Reactions  . A-G Pro     Shots cause tumors in arms  . Eggs Or Egg-Derived Products Other (See Comments)    Just the flu shot (pattient can eat eggs)    Immunization History  Administered Date(s) Administered  . Pneumococcal Conjugate-13 08/02/2015  . Pneumococcal Polysaccharide-23 03/14/2010  . Tdap 04/20/2014  . Zoster 08/16/2015    Current Medications, Allergies, Past Medical History, Past Surgical History, Family History, and Social History were reviewed in [employer] record.   Review of Systems  Constitutional: Negative for fever and unexpected weight  change.  HENT: Positive for congestion, postnasal drip, rhinorrhea and sinus pressure. Negative for dental problem, ear pain, nosebleeds, sneezing, sore throat and trouble swallowing.   Eyes: Negative for redness and itching.  Respiratory: Positive for cough. Negative for chest tightness, shortness of breath and wheezing.   Cardiovascular: Negative for palpitations and leg swelling.  Gastrointestinal: Negative for nausea and vomiting.  Genitourinary: Negative for dysuria.  Musculoskeletal: Negative for joint swelling.  Skin: Negative for rash.  Neurological: Negative for headaches.  Hematological: Does not bruise/bleed easily.  Psychiatric/Behavioral: Negative for dysphoric mood. The patient is not nervous/anxious.        Objective:   Physical Exam  Well-developed female in no acute distress Nose without purulence or discharge noted Neck without lymphadenopathy or thyromegaly Chest with decreased breath sounds, no active wheezes or crackles Cardiac exam with regular rate and rhythm Lower extremities without edema, no cyanosis Alert and oriented, moves all 4 extremities.     Assessment & Plan:    IMP >>     COPD, emphysema => PFTs 12/17 showed severe airflow obstruction w/ DLCO=75% predicted and a 15% improvement in FEV1 after bronchodil    Allergic Rhinitis    Ex-smoker    Medical Issues>  HBP, Osteopenia, Vit D defic...  PLAN >>  10/20/14>   Ajaylah appears stable on her current rx- we discussed f/u CXR & FullPFTs but will put this off til the spring; ROV in 70mo 04/20/15>   We refilled her Albut for NEB w/ instructions to use it Q6h prn;  Reminded of the importance of MUCINEX 600mg - 2Bid w/ fluids for the thick phlegm;  Finish the Pred taper & return in 6wks for f/u CXR and to sched FullPFTs. 06/23/15>   She is back to baseline & CXR clear/ COPD- NAD; rec to continue meds, regular exercise, check FullPFTs... 12/26/15>   Amber Chaney has severe COPD w/ and emphysematous component  AND a reversible component;  REC to continue regular dosing of Symbicort160-2spBid, Spiriva daily, and a regular exercise program;  We are stopping her Lisinopril in favor of LOSARTAN50 & she will f/u w/ her PCP... We plan rov recheck in 27mo   Patient's Medications  New Prescriptions   LOSARTAN (COZAAR) 50 MG TABLET    Take 1 tablet (50 mg total) by mouth daily.  Previous Medications   ALBUTEROL (PROAIR HFA) 108 (90 BASE) MCG/ACT INHALER    Inhale 1-2 puffs into the lungs every 6 (six) hours as needed for wheezing or shortness of breath.   ALBUTEROL (PROVENTIL) (2.5 MG/3ML) 0.083% NEBULIZER SOLUTION    USE 1 VIAL VIA NEBULIZER EVERY 6 HOURS AS NEEDED   AMLODIPINE (NORVASC) 5 MG TABLET    Take 1 tablet by mouth daily.   FLUNISOLIDE (NASALIDE) 25 MCG/ACT (0.025%) SOLN    INSTILL 1 SPRAY IN EACH NOSTRIL TWICE DAILY   LISINOPRIL (PRINIVIL,ZESTRIL) 10 MG TABLET    Take 10 mg by mouth daily.   LORATADINE (CLARITIN) 10 MG TABLET    Take 10 mg by mouth daily.   OMEGA 3 1000 MG CAPS    Take 1 capsule by mouth daily.   SPIRIVA HANDIHALER 18 MCG INHALATION CAPSULE    INHALE CONTENTS OF 1 CAPSULE VIA HANDIHALER DAILY   SYMBICORT 160-4.5 MCG/ACT INHALER    INHALE 2 PUFFS INTO THE LUNGS TWICE DAILY   VITAMIN D, ERGOCALCIFEROL, (DRISDOL) 50000 UNITS CAPS    Take 50,000 Units by mouth every 7 (seven) days.   Modified Medications   No medications on file  Discontinued Medications   MOMETASONE-FORMOTEROL (DULERA) 100-5 MCG/ACT AERO    Reported on 06/23/2015

## 2015-12-26 NOTE — Patient Instructions (Signed)
Today we updated your med list in our EPIC system...    Continue your [place] the same...  Keep up the good work w/ your exercise program...  We are recommending a change in your BP meds>    Stop the Lisinopril due to the "ACE cough"    And start the new BP med- LOSARTAN 50mg  one tab daily...  Monitor your BP at home & call for any questions...  Let's plan a follow up visit in 33mo, sooner if needed for problems.Marland KitchenMarland Kitchen

## 2015-12-27 ENCOUNTER — Ambulatory Visit: Payer: Medicare Other | Admitting: Pulmonary Disease

## 2016-01-17 ENCOUNTER — Telehealth: Payer: Self-pay | Admitting: Pulmonary Disease

## 2016-01-17 NOTE — Telephone Encounter (Signed)
Called and spoke with pt and she is aware that SN is out of the office this week.  Will forward to SN to see if he found out anything about this different flu vaccine.

## 2016-01-24 ENCOUNTER — Other Ambulatory Visit: Payer: Self-pay | Admitting: Pulmonary Disease

## 2016-01-25 NOTE — Telephone Encounter (Signed)
SN please advise. Thanks.  

## 2016-01-25 NOTE — Telephone Encounter (Signed)
Per SN- printed off form from [facility] website regarding fluad- this is an egg based flu vacine with adjuvant recommended for pts 65+.  Per SN, our office offers a similar injection called Fluarix, but she would need to check with her insurance to see if this is covered by her insurance.  Spoke with pt, aware of above.  Nothing further needed.

## 2016-06-26 ENCOUNTER — Ambulatory Visit (INDEPENDENT_AMBULATORY_CARE_PROVIDER_SITE_OTHER): Payer: Medicare Other | Admitting: Pulmonary Disease

## 2016-06-26 ENCOUNTER — Ambulatory Visit (INDEPENDENT_AMBULATORY_CARE_PROVIDER_SITE_OTHER)
Admission: RE | Admit: 2016-06-26 | Discharge: 2016-06-26 | Disposition: A | Discharge status: Home / Self Care | Payer: Medicare Other | Source: Ambulatory Visit | Attending: Pulmonary Disease | Admitting: Pulmonary Disease

## 2016-06-26 VITALS — BP 122/64 | HR 84 | Temp 97.0°F | Ht 60.5 in | Wt 98.2 lb

## 2016-06-26 DIAGNOSIS — J301 Allergic rhinitis due to pollen: Secondary | ICD-10-CM

## 2016-06-26 DIAGNOSIS — Z87891 Personal history of nicotine dependence: Secondary | ICD-10-CM

## 2016-06-26 DIAGNOSIS — J432 Centrilobular emphysema: Secondary | ICD-10-CM

## 2016-06-26 MED ORDER — UMECLIDINIUM BROMIDE 62.5 MCG/INH IN AEPB: 1.0000 | INHALATION_SPRAY | Freq: Every day | RESPIRATORY_TRACT | 11 refills | Status: DC

## 2016-06-26 NOTE — Progress Notes (Signed)
Subjective:    Patient ID: Amber Chaney, female    DOB: 01/10/1951, 66 y.o.   MRN: [id number]  HPI    66 y/o WF, ex-smoker quit in 2014, followed for COPD/ emphysema...  ~  October 16, 2013:  77mo ROV w/ KC>       Patient comes in today for followup of her known COPD. She is staying on her bronchodilator regimen compliantly, however is only taking dulera once a day instead of twice. She feels that her breathing is at a reasonable level, and has not had a recent acute exacerbation. She has had recent sinus issues, and is having trouble with fall allergies. She is having a lot of postnasal drip with throat clearing as well as cough.       PLAN>  The patient is doing fairly well from a pulmonary standpoint, but is having a lot of sinus issues associated with her fall allergies. I have asked her to continue on her bronchodilator regimen, and to try taking an antihistamine at bedtime rather than during the day. I have also recommended sinus rinses to see if this will help with secretions and sinus pressure. Finally, I have encouraged her to work aggressively on some type of conditioning program.  ~  April 19, 2014:  61mo ROV w/ KC>        The patient comes in today for follow-up of her known COPD. She has done very well since the last visit, with no increased symptoms or acute exacerbation. He has been having allergy issues for which she is staying on Claritin-D, and is also asking about a refill on her Flonase. She denies any significant cough or purulence, and feels that her exertional tolerance is at baseline.      PLAN>  The patient continues to do well from a COPD standpoint, with adequate exertional tolerance and no recent flareup. I've asked her to continue on her current maintenance regimen, and to stay as active as possible. She will also continue on her allergy meds.   ~  October 20, 2014:  66mo ROV w/ SN>  Her PCP is [name] PA at [facility]...      66 y/o WF prev followed by  DrClance w/ COPD>  She is an ex-smoker having started at 36, smoked for >40 yrs up to 1ppd, and quit at age 61 in Nov2014;  Currently on Symbicort160-2spBid, Spiriva daily, Prn NEBS w/ Albut vs ProventilHFA (not often), along w/ Claritin & Nasalide prn in spring & fall allergy seasons;  She notes some nasal congestion/ drainage but has remained active & denies any SOB, CP, palpit, cough/ phlegm, edema, etc...       EXAM shows Afeb, VSS, O2sat=97% on RA at rest;  HEENT- neg, mallampati2;  Chest- sl decr BS at bases but clear w/o w/r/r;  Heart- RR w/o m/r/g;  Abd- soft, neg;  Ext- neg w/o c/c/e;  Neuro- intact...  Last Spirometry 09/26/10>  FVC=2.88 (111%), FEV1=1.71 (81%), %1sec=59, mid-flows reduced at 37% predicted;  This is c/w mod airflow obstruction and GOLD Stage 1-2 COPD...  Last CXR 02/03/13 showed bilat breast implants, norm heart size, hyperinflation of lungs but clear w/o acute changes...   She had CT Angio Chest 03/19/12 showing no pulm emboli, Ao OK, no adenopathy, sm calcif granuloma left apex otherw clear lungs w/ subpleural blebs in upper lobes, calcif breast implants...   Last EKG 02/03/13 showed STachy, rate122, biatrial enlargement, no acute changes... IMP/PLAN>>  Heydi appears  stable on her current rx- we discussed f/u CXR & FullPFTs but will put this off til the spring; ROV in 70mo...  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    AR> on Claritin10 & Nasalide;     COPD/emphysema> on Symbicort160-2spBid, Spiriva daily, NEBS w/ Albut  prn    Hx pneumonia> she was [facility] w/ COPD exac +FluA titer but norm CBC and no infiltrate on CXR; treated w/ Tamiflu, Levaquin, Soulmedrol=>Pred & improved...    Ex-smoker> sahe quit smoking in 2014 w/ a 40-50 pack yr smoking hx.    Medical issues> on Omega-3 Fish Oil, VitD- 50K weekly; she has Osteopenia w/ BMD 4/16 showing Tscore -2.4 in Lspine EXAM shows Afeb, VSS, O2sat=96% on RA at rest;  HEENT- neg, mallampati1;  Chest- sl decr BS at bases but clear w/o w/r/r;  Heart- RR w/ gr1/6SEM no r/g;  Abd- soft, neg;  Ext- neg w/o c/c/e;  Neuro- intact... IMP/PLAN>>  We refilled her Albut for NEB w/ instructions to use it Q6h prn;  Reminded of the importance of [place] 600mg - 2Bid w/ fluids for the thick phlegm;  Finish the Pred taper & return in 6wks for f/u CXR and to sched FullPFTs...   ~  June 23, 2015:  40mo ROV w/ SN>  Sarinity reports feeling much better- she has chronic stable SOB/ DOE, chr AM cough, min sputum- no color/ no blood; she's been walking several days per week; she remains on Symbicort160-2spBid, Spiriva daily, Albut Nebs & HFA as needed; she is no longer taking Mucinex; she also has Claritin/ Nasalide for allergy symptoms... Problem list as noted above...    EXAM shows Afeb, VSS, O2sat=98% on RA at rest;  HEENT- neg, mallampati1;  Chest- sl decr BS at bases but clear w/o w/r/r;  Heart- RR w/ gr1/6SEM no r/g;  Abd- soft, neg;  Ext- neg w/o c/c/e;  Neuro- intact...  CXR 06/23/15>  Normal heart size, hyperinflation suggesting emphysema, clear w/o infiltrates etc, bilat implants... IMP/PLAN>>  Brinn's CXR is back to basely COPD, NAD- we discussed setting up Full PFTs to check lung volumes and DLCO  ~  December 26, 2015:  65mo ROV w/ FullPFTs today> [name] indicates that her breathing is stable, no change overall; however she has developed a "tickle" in her throat and of interest she indicates that she has been Dx w. HBP by her new PCP- [place], NP-- placed on  Lisinopril & amlodipine;  I discussed w/ pt how the Lisinopril can coause this tickle & a cough 7 we decided to change to [facility] & she will f/u w/ her PCP... We reviewed the following medical problems during today's office visit >>     AR> on Claritin10 & Nasalide as needed...    COPD/emphysema> on Symbicort160-2spBid, Spiriva daily, NEBS w/ Albut vs HFA prn    Hx pneumonia> she was [facility] w/ COPD exac +FluA titer but norm CBC and no infiltrate on CXR; treated w/ Tamiflu,  Levaquin, Soulmedrol=>Pred & improved...    Ex-smoker> she quit smoking in 2014 w/ a 40-50 pack yr smoking hx.    Medical issues> recent Dx of HBP- started on Amlod5, Lisin10; on Omega-3 Fish Oil, VitD- 50K weekly; she has Osteopenia w/ BMD 4/16 showing Tscore -2.4 in Lspine; she developed Lisin side effect & we changed Lisin10 to [id number] & she will f/u w/ PCP... EXAM shows Afeb, VSS w/ BP=144/80, O2sat=97% on RA at rest;  HEENT- neg, mallampati1;  Chest- sl decr BS at bases but clear w/o w/r/r;  Heart- RR w/ gr1/6SEM no r/g;  Abd- soft, neg;  Ext- neg w/o c/c/e;  Neuro- intact...  FullPFTs 12/26/15>  FVC=2.15 (78%), FEV1=1.00 (47%), %1sec=46, mid-flows reduced at 22% predicted;  FEV1 improved to 1.15 (15%) after bronchodil;  TLC=4.31 (95%), RV=1.79 (93%), RV/TLC=42% and DLCO=75% predicted...  This shows a severe obstructive ventilatory defect w/ some emphysema and a small reversible component... IMP/PLAN>>  Aubrianne has severe COPD w/ and emphysematous component AND a reversible component;  REC to continue regular dosing of Symbicort160-2spBid, Spiriva daily, and a regular exercise program;  We are stopping her Lisinopril in favor of LOSARTAN50 & she will f/u w/ her PCP... We plan rov recheck in 41mo.   ~  June 26, 2016:  26mo ROV & Maribell tells me that her PCP switched the spiriva to Incruse & she likes this better, stating that the spiriva made her thirsty & she was drinking too much water w/ resulting low sodium, once  she switched to the Incruse the sodium improved;  Regimen includes> Symbicort160-2spBid, Incruse once daily, Albut Nebs & HFA as needed + Claritin/ Nasalide... She notes that her breathing is OK w/ min cough & no tickle sensation now, no sput or hemoptysis, no CP, & she is not smoking; she has chr stable DOE & still not exercising regularly "I do yard work" she says... She is asked to check w/ her PCP Suzi Roots Smothers NP at [facility] [place] area) to be sure she is up to date on vaccinations...    She saw PCP- NP-Smothers 06/13/16>  Notes 2-3 cans of beer/ night; BP controlled on Amlod5, Losar50; she needs to elim the BEER to correct the sl hyponatremia     We reviewed the following medical problems during today's office visit >>     AR> on Claritin10 & Nasalide as needed...    COPD/emphysema> on Symbicort160-2spBid, INCRUSE daily, NEBS w/ Albut vs HFA prn    Hx pneumonia> she was [facility] w/ COPD exac +FluA titer but norm CBC and no infiltrate on CXR; treated w/ Tamiflu, Levaquin, Soulmedrol=>Pred & improved...    Ex-smoker> she quit smoking in 2014 w/ a 40-50 pack yr smoking hx.    Medical issues> HBP- on Amlod5, Losar50; on Omega-3 Fish Oil, VitD- 50K weekly; she has Osteopenia w/ BMD 4/16 showing Tscore -2.4 in Lspine; she developed ACE side effect & we changed Lisin10 to [place]- improved. EXAM shows Afeb, VSS w/ BP=128/64, O2sat=97% on RA at rest;  HEENT- neg, mallampati1;  Chest- sl decr BS at bases but clear w/o w/r/r;  Heart- RR w/ gr1/6SEM no r/g;  Abd- soft, neg;  Ext- neg w/o c/c/e;  Neuro- intact...  CXR 06/26/16 (independently reviewed by me in the PACS system)>  Norm heart size, aortic calcif, hyperinflation w/ flattened diaphragms, clear lungs w/o lesions noted- NAD, capsular calcif in breast implants...  LABS in Care Everywhere> 06/13/16-- [place]- ok x Na-132 IMP/PLAN>>  Micheal is stable  w/ her severe COPD/emphysema, last FEV1=1000cc, on triple drug therapy & asked to  increase her exercise program, consider Pulm rehab, avoid infections, etc...    Past Medical History:  Diagnosis Date  . Allergic rhinitis   . COPD (chronic obstructive pulmonary disease) ([place])     Past Surgical History:  Procedure Laterality Date  . APPENDECTOMY    . BREAST SURGERY    . TONSILLECTOMY    . 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 albuterol (PROAIR HFA) 108 (90 Base) MCG/ACT inhaler Inhale 1-2 puffs into the lungs every 6 (six) hours as needed for wheezing or shortness of breath.  Marland Kitchen albuterol (PROVENTIL) (2.5 MG/3ML) 0.083% nebulizer solution USE 1 VIAL VIA NEBULIZER EVERY 6 HOURS AS NEEDED  . amLODipine (NORVASC) 5 MG tablet Take 1 tablet by mouth daily.  . flunisolide (NASALIDE) 25 MCG/ACT (0.025%) SOLN INSTILL 1 SPRAY IN EACH NOSTRIL TWICE DAILY  . loratadine (CLARITIN) 10 MG tablet Take 10 mg by mouth daily.  Marland Kitchen losartan (COZAAR) 50 MG tablet Take 1 tablet (50 mg total) by mouth daily.  . SYMBICORT 160-4.5 MCG/ACT inhaler INHALE 2 PUFFS INTO THE LUNGS TWICE DAILY  . umeclidinium bromide (INCRUSE ELLIPTA) 62.5 MCG/INH AEPB Inhale 1 puff into the lungs daily.  . Vitamin D, Ergocalciferol, (DRISDOL) 50000 UNITS CAPS Take 50,000 Units by mouth every 7 (seven) days.   . [DISCONTINUED] umeclidinium bromide (INCRUSE ELLIPTA) 62.5 MCG/INH AEPB Inhale 1 puff into the lungs daily.  Marland Kitchen lisinopril (PRINIVIL,ZESTRIL) 10 MG tablet Take 10 mg by mouth daily.  . Omega 3 1000 MG CAPS Take 1 capsule by mouth daily.  Marland Kitchen SPIRIVA HANDIHALER 18 MCG inhalation capsule INHALE CONTENTS OF 1 CAPSULE VIA HANDIHALER DAILY (Patient not taking: Reported on 06/26/2016)   No facility-administered encounter medications on file as of 06/26/2016.     Allergies  Allergen Reactions  . A-G Pro     Shots cause tumors in arms  . Eggs Or Egg-Derived Products Other (See Comments)    Just the flu shot (pattient can eat  eggs)    Immunization History  Administered Date(s) Administered  . Influenza,inj,Quad PF,36+ Mos 02/28/2016  . Pneumococcal Conjugate-13 08/02/2015  . Pneumococcal Polysaccharide-23 03/14/2010  . Tdap 04/20/2014  . Zoster 08/16/2015    Current Medications, Allergies, Past Medical History, Past Surgical History, Family History, and Social History were reviewed in [employer] record.   Review of Systems  Constitutional: Negative for fever and unexpected weight change.  HENT: Positive for congestion, postnasal drip, rhinorrhea and sinus pressure. Negative for dental problem, ear pain, nosebleeds, sneezing, sore throat and trouble swallowing.   Eyes: Negative for redness and itching.  Respiratory: Positive for cough. Negative for chest tightness, shortness of breath and wheezing.   Cardiovascular: Negative for palpitations and leg swelling.  Gastrointestinal: Negative for nausea and vomiting.  Genitourinary: Negative for dysuria.  Musculoskeletal: Negative for joint swelling.  Skin: Negative for rash.  Neurological: Negative for headaches.  Hematological: Does not bruise/bleed easily.  Psychiatric/Behavioral: Negative for dysphoric mood. The patient is not nervous/anxious.        Objective:   Physical Exam  Well-developed female in no acute distress Nose without purulence or discharge noted Neck without lymphadenopathy or thyromegaly Chest with decreased breath sounds, no active wheezes or crackles Cardiac exam with regular rate and rhythm Lower extremities without edema, no cyanosis Alert and oriented,  moves all 4 extremities.     Assessment & Plan:    IMP >>     COPD, emphysema => PFTs 12/17 showed severe airflow obstruction w/ DLCO=75% predicted and a 15% improvement in FEV1 after bronchodil    Allergic Rhinitis    Ex-smoker    Medical Issues>  HBP, Osteopenia, Vit D defic...  PLAN >>  10/20/14>   Ryley appears stable on her current rx-  we discussed f/u CXR & FullPFTs but will put this off til the spring; ROV in 21mo 04/20/15>   We refilled her Albut for NEB w/ instructions to use it Q6h prn;  Reminded of the importance of [place] 600mg - 2Bid w/ fluids for the thick phlegm;  Finish the Pred taper & return in 6wks for f/u CXR and to sched FullPFTs. 06/23/15>   She is back to baseline & CXR clear/ COPD- NAD; rec to continue meds, regular exercise, check FullPFTs... 12/26/15>   Danice has severe COPD w/ and emphysematous component AND a reversible component;  REC to continue regular dosing of Symbicort160-2spBid, Spiriva daily, and a regular exercise program;  We are stopping her Lisinopril in favor of LOSARTAN50 & she will f/u w/ her PCP... We plan rov recheck in 67mo 06/26/16>   Kyliah is stable w/ her severe COPD/emphysema, last FEV1=1000cc, on triple drug therapy & asked to increase her exercise program, consider Pulm rehab, avoid infections, etc...   Patient's Medications  New Prescriptions   No medications on file  Previous Medications   ALBUTEROL (PROAIR HFA) 108 (90 BASE) MCG/ACT INHALER    Inhale 1-2 puffs into the lungs every 6 (six) hours as needed for wheezing or shortness of breath.   ALBUTEROL (PROVENTIL) (2.5 MG/3ML) 0.083% NEBULIZER SOLUTION    USE 1 VIAL VIA NEBULIZER EVERY 6 HOURS AS NEEDED   AMLODIPINE (NORVASC) 5 MG TABLET    Take 1 tablet by mouth daily.   FLUNISOLIDE (NASALIDE) 25 MCG/ACT (0.025%) SOLN    INSTILL 1 SPRAY IN EACH NOSTRIL TWICE DAILY   LISINOPRIL (PRINIVIL,ZESTRIL) 10 MG TABLET    Take 10 mg by mouth daily.   LORATADINE (CLARITIN) 10 MG TABLET    Take 10 mg by mouth daily.   LOSARTAN (COZAAR) 50 MG TABLET    Take 1 tablet (50 mg total) by mouth daily.   OMEGA 3 1000 MG CAPS    Take 1 capsule by mouth daily.   SPIRIVA HANDIHALER 18 MCG INHALATION CAPSULE    INHALE CONTENTS OF 1 CAPSULE VIA HANDIHALER DAILY   SYMBICORT 160-4.5 MCG/ACT INHALER    INHALE 2 PUFFS INTO THE LUNGS TWICE DAILY   VITAMIN  D, ERGOCALCIFEROL, (DRISDOL) 50000 UNITS CAPS    Take 50,000 Units by mouth every 7 (seven) days.   Modified Medications   Modified Medication Previous Medication   UMECLIDINIUM BROMIDE (INCRUSE ELLIPTA) 62.5 MCG/INH AEPB umeclidinium bromide (INCRUSE ELLIPTA) 62.5 MCG/INH AEPB      Inhale 1 puff into the lungs daily.    Inhale 1 puff into the lungs daily.  Discontinued Medications   No medications on file

## 2016-06-26 NOTE — Patient Instructions (Signed)
Today we updated your med list in our EPIC system...    Continue your current medications the same...  We refilled the INCRUSE - one inhalation daily...  Today we checked a follow up CXR...    Marland KitchenWe will contact you w/ the results when available...   We wrote a prescription for a new NEBULIZER machine...    Check w/ [facility] home care or other supplier about Medicare coverage...  Stay as active as poss...  Marland KitchenCall for any questions...  Let's plan a follow up visit in 65mo, sooner if needed for problems.Marland KitchenMarland Kitchen

## 2016-08-28 ENCOUNTER — Other Ambulatory Visit: Payer: Self-pay | Admitting: Pulmonary Disease

## 2016-09-20 ENCOUNTER — Other Ambulatory Visit: Payer: Self-pay | Admitting: Pulmonary Disease

## 2016-12-26 ENCOUNTER — Ambulatory Visit: Payer: Medicare Other | Admitting: Pulmonary Disease

## 2017-01-17 ENCOUNTER — Encounter: Payer: Self-pay | Admitting: Pulmonary Disease

## 2017-01-17 ENCOUNTER — Ambulatory Visit (INDEPENDENT_AMBULATORY_CARE_PROVIDER_SITE_OTHER): Payer: Medicare Other | Admitting: Pulmonary Disease

## 2017-01-17 VITALS — BP 114/70 | HR 96 | Temp 98.2°F | Ht 59.5 in | Wt 98.2 lb

## 2017-01-17 DIAGNOSIS — Z87891 Personal history of nicotine dependence: Secondary | ICD-10-CM

## 2017-01-17 DIAGNOSIS — J432 Centrilobular emphysema: Secondary | ICD-10-CM | POA: Diagnosis not present

## 2017-01-17 MED ORDER — UMECLIDINIUM BROMIDE 62.5 MCG/INH IN AEPB: 1.0000 | INHALATION_SPRAY | Freq: Every day | RESPIRATORY_TRACT | 0 refills | Status: DC

## 2017-01-17 MED ORDER — BUDESONIDE-FORMOTEROL FUMARATE 160-4.5 MCG/ACT IN AERO: 2.0000 | INHALATION_SPRAY | Freq: Two times a day (BID) | RESPIRATORY_TRACT | 0 refills | Status: DC

## 2017-01-17 NOTE — Patient Instructions (Signed)
Today we updated your med list in our EPIC system...    Continue your current medications the same...  We discussed the benefits of a regular exercise program...  Call for any questions...  Let's plan a follow up visit in 6-9, sooner if needed for problems.Marland KitchenMarland Kitchen

## 2017-01-23 ENCOUNTER — Other Ambulatory Visit: Payer: Self-pay | Admitting: Pulmonary Disease

## 2017-02-19 ENCOUNTER — Other Ambulatory Visit: Payer: Self-pay | Admitting: Pulmonary Disease

## 2017-03-31 ENCOUNTER — Other Ambulatory Visit: Payer: Self-pay | Admitting: Pulmonary Disease

## 2017-04-19 ENCOUNTER — Other Ambulatory Visit: Payer: Self-pay | Admitting: Otolaryngology

## 2017-05-22 ENCOUNTER — Other Ambulatory Visit: Payer: Self-pay | Admitting: Pulmonary Disease

## 2017-06-13 ENCOUNTER — Other Ambulatory Visit: Payer: Self-pay | Admitting: Pulmonary Disease

## 2017-07-03 ENCOUNTER — Other Ambulatory Visit: Payer: Self-pay | Admitting: Pulmonary Disease

## 2017-08-01 ENCOUNTER — Encounter: Payer: Self-pay | Admitting: Pulmonary Disease

## 2017-08-01 ENCOUNTER — Ambulatory Visit (INDEPENDENT_AMBULATORY_CARE_PROVIDER_SITE_OTHER): Payer: Medicare Other | Admitting: Pulmonary Disease

## 2017-08-01 VITALS — BP 106/62 | HR 81 | Temp 98.2°F | Ht 59.5 in | Wt 93.6 lb

## 2017-08-01 DIAGNOSIS — C049 Malignant neoplasm of floor of mouth, unspecified: Secondary | ICD-10-CM

## 2017-08-01 DIAGNOSIS — J432 Centrilobular emphysema: Secondary | ICD-10-CM | POA: Diagnosis not present

## 2017-08-01 DIAGNOSIS — Z87891 Personal history of nicotine dependence: Secondary | ICD-10-CM | POA: Diagnosis not present

## 2017-08-01 DIAGNOSIS — J301 Allergic rhinitis due to pollen: Secondary | ICD-10-CM

## 2017-08-01 NOTE — Patient Instructions (Addendum)
Today we updated your med list in our EPIC system...    Continue your current medications the same...  Keep up the good work w/ your exercise program...  Call for any questions or if I can be of service in any way...   At your convenience, set up a fiollow up appt with my associate- Dr. Ross Ludwig in Jan-Feb 2020.Marland KitchenMarland Kitchen

## 2017-08-04 DIAGNOSIS — C049 Malignant neoplasm of floor of mouth, unspecified: Secondary | ICD-10-CM | POA: Insufficient documentation

## 2017-08-04 NOTE — Progress Notes (Signed)
Subjective:    Patient ID: Amber Chaney, female    DOB: 22-Nov-1950, 67 y.o.   MRN: [id number]  HPI    67 y/o WF, ex-smoker quit in 2014, followed for COPD/ emphysema...  ~  October 16, 2013:  78mo ROV w/ Amber Chaney>       Patient comes in today for followup of her known COPD. She is staying on her bronchodilator regimen compliantly, however is only taking dulera once a day instead of twice. She feels that her breathing is at a reasonable level, and has not had a recent acute exacerbation. She has had recent sinus issues, and is having trouble with fall allergies. She is having a lot of postnasal drip with throat clearing as well as cough.       PLAN>  The patient is doing fairly well from a pulmonary standpoint, but is having a lot of sinus issues associated with her fall allergies. I have asked her to continue on her bronchodilator regimen, and to try taking an antihistamine at bedtime rather than during the day. I have also recommended sinus rinses to see if this will help with secretions and sinus pressure. Finally, I have encouraged her to work aggressively on some type of conditioning program.  ~  April 19, 2014:  92mo ROV w/ Amber Chaney>        The patient comes in today for follow-up of her known COPD. She has done very well since the last visit, with no increased symptoms or acute exacerbation. He has been having allergy issues for which she is staying on Claritin-D, and is also asking about a refill on her Flonase. She denies any significant cough or purulence, and feels that her exertional tolerance is at baseline.      PLAN>  The patient continues to do well from a COPD standpoint, with adequate exertional tolerance and no recent flareup. I've asked her to continue on her current maintenance regimen, and to stay as active as possible. She will also continue on her allergy meds.   ~  October 20, 2014:  51mo ROV w/ Amber Chaney>  Her PCP is Amber Chimes PA at [facility]...      67 y/o WF prev followed by  Amber Chaney w/ COPD>  She is an ex-smoker having started at 51, smoked for >40 yrs up to 1ppd, and quit at age 63 in Nov2014;  Currently on Symbicort160-2spBid, Spiriva daily, Prn NEBS w/ Albut vs ProventilHFA (not often), along w/ Claritin & Nasalide prn in spring & fall allergy seasons;  She notes some nasal congestion/ drainage but has remained active & denies any SOB, CP, palpit, cough/ phlegm, edema, etc...       EXAM shows Afeb, VSS, O2sat=97% on RA at rest;  HEENT- neg, mallampati2;  Chest- sl decr BS at bases but clear w/o w/r/r;  Heart- RR w/o m/r/g;  Abd- soft, neg;  Ext- neg w/o c/c/e;  Neuro- intact...  Last Spirometry 09/26/10>  FVC=2.88 (111%), FEV1=1.71 (81%), %1sec=59, mid-flows reduced at 37% predicted;  This is c/w mod airflow obstruction and GOLD Stage 1-2 COPD...  Last CXR 02/03/13 showed bilat breast implants, norm heart size, hyperinflation of lungs but clear w/o acute changes...   She had CT Angio Chest 03/19/12 showing no pulm emboli, Ao OK, no adenopathy, sm calcif granuloma left apex otherw clear lungs w/ subpleural blebs in upper lobes, calcif breast implants...   Last EKG 02/03/13 showed STachy, rate122, biatrial enlargement, no acute changes... IMP/PLAN>>  Amber Chaney appears  stable on her current rx- we discussed f/u CXR & FullPFTs but will put this off til the spring; ROV in 70mo...  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    AR> on Claritin10 & Nasalide;     COPD/emphysema> on Symbicort160-2spBid, Spiriva daily, NEBS w/ Albut  prn    Hx pneumonia> she was Amber [facility]aney w/ COPD exac +FluA titer but norm CBC and no infiltrate on CXR; treated w/ Tamiflu, Levaquin, Soulmedrol=>Pred & improved...    Ex-smoker> sahe quit smoking in 2014 w/ a 40-50 pack yr smoking hx.    Medical issues> on Omega-3 Fish Oil, VitD- 50K weekly; she has Osteopenia w/ BMD 4/16 showing Tscore -2.4 in Lspine EXAM shows Afeb, VSS, O2sat=96% on RA at rest;  HEENT- neg, mallampati1;  Chest- sl decr BS at bases but clear w/o w/r/r;  Heart- RR w/ gr1/6SEM no r/g;  Abd- soft, neg;  Ext- neg w/o c/c/e;  Neuro- intact... IMP/PLAN>>  We refilled her Albut for NEB w/ instructions to use it Q6h prn;  Reminded of the importance of [place] 600mg - 2Bid w/ fluids for the thick phlegm;  Finish the Pred taper & return in 6wks for f/u CXR and to sched FullPFTs...   ~  June 23, 2015:  40mo ROV w/ Amber Chaney>  Amber Chaney reports feeling much better- she has chronic stable SOB/ DOE, chr AM cough, min sputum- no color/ no blood; she's been walking several days per week; she remains on Symbicort160-2spBid, Spiriva daily, Albut Nebs & HFA as needed; she is no longer taking Mucinex; she also has Claritin/ Nasalide for allergy symptoms... Problem list as noted above...    EXAM shows Afeb, VSS, O2sat=98% on RA at rest;  HEENT- neg, mallampati1;  Chest- sl decr BS at bases but clear w/o w/r/r;  Heart- RR w/ gr1/6SEM no r/g;  Abd- soft, neg;  Ext- neg w/o c/c/e;  Neuro- intact...  CXR 06/23/15>  Normal heart size, hyperinflation suggesting emphysema, clear w/o infiltrates etc, bilat implants... IMP/PLAN>>  Amber Chaney CXR is back to basely COPD, NAD- we discussed setting up Full PFTs to check lung volumes and DLCO  ~  December 26, 2015:  65mo ROV w/ FullPFTs today> Amber Chaney indicates that her breathing is stable, no change overall; however she has developed a "tickle" in her throat and of interest she indicates that she has been Dx w. HBP by her new PCP- [facility], Amber Chaney-- placed on  Lisinopril & amlodipine;  I discussed w/ pt how the Lisinopril can coause this tickle & a cough 7 we decided to change to [facility] & she will f/u w/ her PCP... We reviewed the following medical problems during today's office visit >>     AR> on Claritin10 & Nasalide as needed...    COPD/emphysema> on Symbicort160-2spBid, Spiriva daily, NEBS w/ Albut vs HFA prn    Hx pneumonia> she was [facility] w/ COPD exac +FluA titer but norm CBC and no infiltrate on CXR; treated w/ Tamiflu,  Levaquin, Soulmedrol=>Pred & improved...    Ex-smoker> she quit smoking in 2014 w/ a 40-50 pack yr smoking hx.    Medical issues> recent Dx of HBP- started on Amlod5, Lisin10; on Omega-3 Fish Oil, VitD- 50K weekly; she has Osteopenia w/ BMD 4/16 showing Tscore -2.4 in Lspine; she developed Lisin side effect & we changed Lisin10 to [id number] & she will f/u w/ PCP... EXAM shows Afeb, VSS w/ BP=144/80, O2sat=97% on RA at rest;  HEENT- neg, mallampati1;  Chest- sl decr BS at bases but clear w/o w/r/r;  Heart- RR w/ gr1/6SEM no r/g;  Abd- soft, neg;  Ext- neg w/o c/c/e;  Neuro- intact...  FullPFTs 12/26/15>  FVC=2.15 (78%), FEV1=1.00 (47%), %1sec=46, mid-flows reduced at 22% predicted;  FEV1 improved to 1.15 (15%) after bronchodil;  TLC=4.31 (95%), RV=1.79 (93%), RV/TLC=42% and DLCO=75% predicted...  This shows a severe obstructive ventilatory defect w/ some emphysema and a small reversible component... IMP/PLAN>>  Amber Chaney has severe COPD w/ and emphysematous component AND a reversible component;  REC to continue regular dosing of Symbicort160-2spBid, Spiriva daily, and a regular exercise program;  We are stopping her Lisinopril in favor of LOSARTAN50 & she will f/u w/ her PCP... We plan rov recheck in 74mo.  ~  June 26, 2016:  55mo ROV & Amber Chaney tells me that her PCP switched the spiriva to Incruse & she likes this better, stating that the spiriva made her thirsty & she was drinking too much water w/ resulting low sodium, once she  switched to the Incruse the sodium improved;  Regimen includes> Symbicort160-2spBid, Incruse once daily, Albut Nebs & HFA as needed + Claritin/ Nasalide... She notes that her breathing is OK w/ min cough & no tickle sensation now, no sput or hemoptysis, no CP, & she is not smoking; she has chr stable DOE & still not exercising regularly "I do yard work" she says... She is asked to check w/ her PCP Suzi Roots Smothers Amber Chaney at [facility] [place] area) to be sure she is up to date on vaccinations...    She saw PCP- Amber Chaney-Smothers 06/13/16>  Notes 2-3 cans of beer/ night; BP controlled on Amlod5, Losar50; she needs to elim the BEER to correct the sl hyponatremia  We reviewed the following medical problems during today's office visit>      AR> on Claritin10 & Nasalide as needed...    COPD/emphysema> on Symbicort160-2spBid, INCRUSE daily, NEBS w/ Albut vs HFA prn    Hx pneumonia> she was [facility] w/ COPD exac +FluA titer but norm CBC and no infiltrate on CXR; treated w/ Tamiflu, Levaquin, Soulmedrol=>Pred & improved...    Ex-smoker> she quit smoking in 2014 w/ a 40-50 pack yr smoking hx.    Medical issues> HBP- on Amlod5, Losar50; on Omega-3 Fish Oil, VitD- 50K weekly; she has Osteopenia w/ BMD 4/16 showing Tscore -2.4 in Lspine; she developed ACE side effect & we changed Lisin10 to [place]- improved. EXAM shows Afeb, VSS w/ BP=128/64, O2sat=97% on RA at rest;  HEENT- neg, mallampati1;  Chest- sl decr BS at bases but clear w/o w/r/r;  Heart- RR w/ gr1/6SEM no r/g;  Abd- soft, neg;  Ext- neg w/o c/c/e;  Neuro- intact...  CXR 06/26/16 (independently reviewed by me in the PACS system)>  Norm heart size, aortic calcif, hyperinflation w/ flattened diaphragms, clear lungs w/o lesions noted- NAD, capsular calcif in breast implants...  LABS in Care Everywhere> 06/13/16-- [facility]- ok x Na-132 IMP/PLAN>>  Frederick is stable w/ her severe COPD/emphysema,  last FEV1=1000cc, on triple drug therapy & asked to increase  her exercise program, consider Pulm rehab, avoid infections, etc...  ~  January 17, 2017:  15mo ROV & pulmonary follow up visit>  Amber Chaney returns for pulmonary follow up having had a good interval- doing satis, breathing well with good days and bad days, SOB/DOE w/o change, occas feels tight, exercise by walking; she notes min cough, small amt clear phlegm, no hemoptysis, no CP, etc... She reports check up w/ her non-[facility] PCP in [id number] w/ "labs and shots" reports doing well- no notes avail to review...  We reviewed the following medical problems during today's office visit>      AR> on Claritin10 & Nasalide as needed...    COPD/emphysema> on Symbicort160-2spBid, INCRUSE daily, NEBS w/ Albut vs HFA prn    Hx pneumonia> she was [facility] w/ COPD exac +FluA titer but norm CBC and no infiltrate on CXR; treated w/ Tamiflu, Levaquin, Soulmedrol=>Pred & improved...    Ex-smoker> she quit smoking in 2014 w/ a 40-50 pack yr smoking hx.    Medical issues> HBP- on Amlod5, Losar50; on Omega-3 Fish Oil, VitD- 50K weekly; she has Osteopenia w/ BMD 4/16 showing Tscore -2.4 in Lspine; she developed ACE side effect & we changed Lisin10 to [place]- improved. EXAM shows Afeb, VSS w/ BP=128/64, O2sat=97% on RA at rest;  HEENT- neg, mallampati1;  Chest- sl decr BS at bases but clear w/o w/r/r;  Heart- RR w/ gr1/6SEM no r/g;  Abd- soft, neg;  Ext- neg w/o c/c/e;  Neuro- intact... IMP/PLAN>>  Amber Chaney is stable w/ her GOLD Stage 2-3 COPD on triple therapy w/ Symbicort & Incruse=> continue same + exercise program, continue to avoid infections, etc... She will call for any problems otherw plan rov in 6-40mo...    ~  August 01, 2017::  4mo ROV & Amber Chaney reports another good interval- no new problems or concerns;  She is breathing well, denies much cough cough (small amt in AM), sputum (sm amt beige), or blood; she is walking regularly & doing well, notes some chr stable dyspnea w/ stairs & hills; denies CP, palpit, edema...  She relates a story about feeling a bump on the floor of her mouth- went to dentist in [place], sent to oral Chaney w/ Bx pos for Squamous Cell Ca;  eventually on to ENT- DrWolicki w/ surgery in his office 07/15/17 [facility] ("he got it all") and we will call for records so they can be scanned into Epic-EMR...  We reviewed the following medical problems during today's office visit>      AR> on Claritin10 & Nasalide as needed...    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     COPD/emphysema> on Symbicort160-2spBid, INCRUSE daily, NEBS w/ Albut vs HFA prn; she denies much cough/ sput, and has chr stable DOE w/ stairs & hills...    Hx pneumonia> she was [place] w/ COPD exac +FluA titer but norm CBC and no infiltrate on CXR; treated w/ Tamiflu, Levaquin, Soulmedrol=>Pred & improved...    Ex-smoker> she quit smoking in 2014 w/ a 40-50 pack yr smoking hx.    Medical issues> HBP- on Amlod5, Losar50; on Omega-3 Fish Oil, VitD- 50K weekly; she has Osteopenia w/ BMD 4/16 showing Tscore -2.4 in Lspine; she developed ACE side effect & we changed Lisin10 to [place]- improved. EXAM shows Afeb, VSS  w/ BP=128/64, O2sat=97% on RA at rest;  HEENT- neg, mallampati1;  Chest- sl decr BS at bases but clear w/o w/r/r;  Heart- RR w/ gr1/6SEM no r/g;  Abd- soft, neg;  Ext- neg w/o c/c/e;  Neuro- intact... IMP/PLAN>>  Amber Chaney has been Dx w/ [facility] of the mouth by DrWolicki, ENT- s/p surg & continues to follow up w/ him, we have requested records;  Her COPD/ emphysema is stable on triple therapy & she has not had any recent exacerbations;  rec to continue Symbicort & Incruse regularly, maintain exercise program, she is up to date on vaccinations...     Past Medical History:  Diagnosis Date  . Allergic rhinitis   . COPD (chronic obstructive pulmonary disease) ([place])     Past Surgical History:  Procedure Laterality  Date  . APPENDECTOMY    . BREAST SURGERY    . TONSILLECTOMY    . tumor removed     from arms d/t cold and flu shots    Outpatient Encounter Medications as of 08/01/2017  Medication Sig  . albuterol (PROVENTIL HFA;VENTOLIN HFA) 108 (90 Base) MCG/ACT inhaler INHALE 1-2 PUFFS INTO THE LUNGS EVERY 6 HOURS AS NEEDED FOR WHEEZING OR SHORTNESS OF BREATH  . albuterol (PROVENTIL) (2.5 MG/3ML) 0.083% nebulizer solution USE 1 VIAL VIA NEBULIZER EVERY 6 HOURS AS NEEDED  . amLODipine (NORVASC) 5 MG tablet Take 1 tablet by mouth daily.  . flunisolide (NASALIDE) 25 MCG/ACT (0.025%) SOLN USE 1 SPRAY IN EACH NOSTRIL TWICE DAILY  . loratadine (CLARITIN) 10 MG tablet Take 10 mg by mouth daily.  Marland Kitchen losartan (COZAAR) 50 MG tablet Take 1 tablet (50 mg total) by mouth daily.  . SYMBICORT 160-4.5 MCG/ACT inhaler INHALE 2 PUFFS INTO THE LUNGS TWICE DAILY  . umeclidinium bromide (INCRUSE ELLIPTA) 62.5 MCG/INH AEPB Inhale 1 puff into the lungs daily.  . Vitamin D, Ergocalciferol, (DRISDOL) 50000 UNITS CAPS Take 50,000 Units by mouth every 7 (seven) days.   . [DISCONTINUED] budesonide-formoterol (SYMBICORT) 160-4.5 MCG/ACT inhaler Inhale 2 puffs into the lungs 2 (two) times daily.  . [DISCONTINUED] INCRUSE ELLIPTA 62.5 MCG/INH AEPB INHALE 1 PUFF INTO THE LUNGS DAILY   No facility-administered encounter medications on file as of 08/01/2017.     Allergies  Allergen Reactions  . A-G Pro     Shots cause tumors in arms  . Eggs Or Egg-Derived Products Other (See Comments)    Just the flu shot (pattient can eat eggs)    Immunization History  Administered Date(s) Administered  . Influenza,inj,Quad PF,6+ Mos 02/28/2016  . Influenza,inj,Quad PF,6-35 Mos 11/15/2016  . Pneumococcal Conjugate-13 08/02/2015, 11/15/2016  . Pneumococcal Polysaccharide-23 03/14/2010  . Tdap 04/20/2014  . Zoster 08/16/2015  Pt indicates that she's received all indicated vaccines at PCP office...   Current Medications, Allergies, Past  Medical History, Past Surgical History, Family History, and Social History were reviewed in [employer] record.   Review of Systems  Constitutional: Negative for fever and unexpected weight change.  HENT: Positive for congestion, postnasal drip, rhinorrhea and sinus pressure. Negative for dental problem, ear pain, nosebleeds, sneezing, sore throat and trouble swallowing.   Eyes: Negative for redness and itching.  Respiratory: Positive for cough. Negative for chest tightness, shortness of breath and wheezing.   Cardiovascular: Negative for palpitations and leg swelling.  Gastrointestinal: Negative for nausea and vomiting.  Genitourinary: Negative for dysuria.  Musculoskeletal: Negative for joint swelling.  Skin: Negative for rash.  Neurological: Negative for headaches.  Hematological: Does not bruise/bleed easily.  Psychiatric/Behavioral: Negative for dysphoric mood. The patient is not nervous/anxious.        Objective:   Physical Exam  Well-developed female in no acute distress Nose without purulence or discharge noted Neck without lymphadenopathy or thyromegaly Chest with decreased breath sounds, no active wheezes or crackles Cardiac exam with regular rate and rhythm Lower extremities without edema, no cyanosis Alert and oriented, moves all 4 extremities.     Assessment & Plan:    IMP >>     COPD, emphysema => PFTs 12/17 showed severe airflow obstruction w/ DLCO=75% predicted and a 15% improvement in FEV1 after bronchodil    Allergic Rhinitis    Ex-smoker    Medical Issues>  HBP, Osteopenia, Vit D defic...  PLAN >>  10/20/14>   Marcena appears stable on her current rx- we discussed f/u CXR & FullPFTs but will put this off til the spring; ROV in 45mo 04/20/15>   We refilled her Albut for NEB w/ instructions to use it Q6h prn;  Reminded of the importance of MUCINEX 600mg - 2Bid w/ fluids for the thick phlegm;  Finish the Pred taper & return in 6wks for f/u  CXR and to sched FullPFTs. 06/23/15>   She is back to baseline & CXR clear/ COPD- NAD; rec to continue meds, regular exercise, check FullPFTs... 12/26/15>   Otilia has severe COPD w/ and emphysematous component AND a reversible component;  REC to continue regular dosing of Symbicort160-2spBid, Spiriva daily, and a regular exercise program;  We are stopping her Lisinopril in favor of LOSARTAN50 & she will f/u w/ her PCP... We plan rov recheck in 78mo 06/26/16>   Windie is stable w/ her severe COPD/emphysema, last FEV1=1000cc, on triple drug therapy & asked to increase her exercise program, consider Pulm rehab, avoid infections, etc... 01/17/17>   Alesana is stable w/ her GOLD Stage 2-3 COPD on triple therapy w/ Symbicort & Incruse=> continue same + exercise program, continue to avoid infections, etc... She will call for any problems otherw plan rov in 6-22mo 08/01/17>   Amber Chaney has been Dx w/ [facility] of the mouth by DrWolicki, ENT- s/p surg & continues to follow up w/ him, we have requested records;  Her COPD/ emphysema is stable on triple therapy & she has not had any recent exacerbations;  rec to continue Symbicort & Incruse regularly, maintain exercise program, she is up to date on vaccinations...   Patient's Medications  New Prescriptions   No medications on file  Previous Medications   ALBUTEROL (PROVENTIL HFA;VENTOLIN HFA) 108 (90 BASE) MCG/ACT INHALER    INHALE 1-2 PUFFS INTO THE LUNGS EVERY 6 HOURS AS NEEDED FOR WHEEZING OR SHORTNESS OF BREATH   ALBUTEROL (PROVENTIL) (2.5 MG/3ML) 0.083% NEBULIZER SOLUTION    USE 1 VIAL VIA NEBULIZER EVERY 6 HOURS AS NEEDED   AMLODIPINE (NORVASC) 5 MG TABLET    Take 1 tablet by mouth daily.   FLUNISOLIDE (NASALIDE) 25 MCG/ACT (0.025%) SOLN    USE 1 SPRAY IN EACH NOSTRIL TWICE DAILY   LORATADINE (CLARITIN) 10 MG TABLET    Take 10 mg by mouth daily.   LOSARTAN (COZAAR) 50 MG TABLET    Take 1 tablet (50 mg total) by mouth daily.   SYMBICORT 160-4.5 MCG/ACT  INHALER    INHALE 2 PUFFS INTO THE LUNGS TWICE DAILY   UMECLIDINIUM BROMIDE (INCRUSE ELLIPTA) 62.5 MCG/INH AEPB    Inhale 1 puff into the lungs daily.   VITAMIN D, ERGOCALCIFEROL, (DRISDOL) 50000 UNITS CAPS    Take  50,000 Units by mouth every 7 (seven) days.   Modified Medications   No medications on file  Discontinued Medications   BUDESONIDE-FORMOTEROL (SYMBICORT) 160-4.5 MCG/ACT INHALER    Inhale 2 puffs into the lungs 2 (two) times daily.   INCRUSE ELLIPTA 62.5 MCG/INH AEPB    INHALE 1 PUFF INTO THE LUNGS DAILY

## 2017-08-04 NOTE — Progress Notes (Signed)
Subjective:    Patient ID: Amber Chaney, female    DOB: [birth year], 67 y.o.   MRN: [id number]  HPI    67 y/o WF, ex-smoker quit in 2014, followed for COPD/ emphysema...  ~  October 16, 2013:  43mo ROV w/ KC>       Patient comes in today for followup of her known COPD. She is staying on her bronchodilator regimen compliantly, however is only taking dulera once a day instead of twice. She feels that her breathing is at a reasonable level, and has not had a recent acute exacerbation. She has had recent sinus issues, and is having trouble with fall allergies. She is having a lot of postnasal drip with throat clearing as well as cough.       PLAN>  The patient is doing fairly well from a pulmonary standpoint, but is having a lot of sinus issues associated with her fall allergies. I have asked her to continue on her bronchodilator regimen, and to try taking an antihistamine at bedtime rather than during the day. I have also recommended sinus rinses to see if this will help with secretions and sinus pressure. Finally, I have encouraged her to work aggressively on some type of conditioning program.  ~  April 19, 2014:  50mo ROV w/ KC>        The patient comes in today for follow-up of her known COPD. She has done very well since the last visit, with no increased symptoms or acute exacerbation. He has been having allergy issues for which she is staying on Claritin-D, and is also asking about a refill on her Flonase. She denies any significant cough or purulence, and feels that her exertional tolerance is at baseline.      PLAN>  The patient continues to do well from a COPD standpoint, with adequate exertional tolerance and no recent flareup. I've asked her to continue on her current maintenance regimen, and to stay as active as possible. She will also continue on her allergy meds.   ~  October 20, 2014:  51mo ROV w/ SN>  Her PCP is Amber Chimes PA at [facility]...      67 y/o WF prev followed by  DrClance w/ COPD>  She is an ex-smoker having started at 81, smoked for >40 yrs up to 1ppd, and quit at age 45 in Nov2014;  Currently on Symbicort160-2spBid, Spiriva daily, Prn NEBS w/ Albut vs ProventilHFA (not often), along w/ Claritin & Nasalide prn in spring & fall allergy seasons;  She notes some nasal congestion/ drainage but has remained active & denies any SOB, CP, palpit, cough/ phlegm, edema, etc...       EXAM shows Afeb, VSS, O2sat=97% on RA at rest;  HEENT- neg, mallampati2;  Chest- sl decr BS at bases but clear w/o w/r/r;  Heart- RR w/o m/r/g;  Abd- soft, neg;  Ext- neg w/o c/c/e;  Neuro- intact...  Last Spirometry 09/26/10>  FVC=2.88 (111%), FEV1=1.71 (81%), %1sec=59, mid-flows reduced at 37% predicted;  This is c/w mod airflow obstruction and GOLD Stage 1-2 COPD...  Last CXR 02/03/13 showed bilat breast implants, norm heart size, hyperinflation of lungs but clear w/o acute changes...   She had CT Angio Chest 03/19/12 showing no pulm emboli, Ao OK, no adenopathy, sm calcif granuloma left apex otherw clear lungs w/ subpleural blebs in upper lobes, calcif breast implants...   Last EKG 02/03/13 showed STachy, rate122, biatrial enlargement, no acute changes... IMP/PLAN>>  Amber Chaney appears  stable on her current rx- we discussed f/u CXR & FullPFTs but will put this off til the spring; ROV in 51mo...  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    AR> on Claritin10 & Nasalide;     COPD/emphysema> on Symbicort160-2spBid, Spiriva daily, NEBS w/ Albut  prn    Hx pneumonia> she was [facility] w/ COPD exac +FluA titer but norm CBC and no infiltrate on CXR; treated w/ Tamiflu, Levaquin, Soulmedrol=>Pred & improved...    Ex-smoker> sahe quit smoking in 2014 w/ a 40-50 pack yr smoking hx.    Medical issues> on Omega-3 Fish Oil, VitD- 50K weekly; she has Osteopenia w/ BMD 4/16 showing Tscore -2.4 in Lspine EXAM shows Afeb, VSS, O2sat=96% on RA at rest;  HEENT- neg, mallampati1;  Chest- sl decr BS at bases but clear w/o w/r/r;  Heart- RR w/ gr1/6SEM no r/g;  Abd- soft, neg;  Ext- neg w/o c/c/e;  Neuro- intact... IMP/PLAN>>  We refilled her Albut for NEB w/ instructions to use it Q6h prn;  Reminded of the importance of Radnor 600mg - 2Bid w/ fluids for the thick phlegm;  Finish the Pred taper & return in 6wks for f/u CXR and to sched FullPFTs...   ~  June 23, 2015:  21mo ROV w/ SN>  [name] reports feeling much better- she has chronic stable SOB/ DOE, chr AM cough, min sputum- no color/ no blood; she's been walking several days per week; she remains on Symbicort160-2spBid, Spiriva daily, Albut Nebs & HFA as needed; she is no longer taking Mucinex; she also has Claritin/ Nasalide for allergy symptoms... Problem list as noted above...    EXAM shows Afeb, VSS, O2sat=98% on RA at rest;  HEENT- neg, mallampati1;  Chest- sl decr BS at bases but clear w/o w/r/r;  Heart- RR w/ gr1/6SEM no r/g;  Abd- soft, neg;  Ext- neg w/o c/c/e;  Neuro- intact...  CXR 06/23/15>  Normal heart size, hyperinflation suggesting emphysema, clear w/o infiltrates etc, bilat implants... IMP/PLAN>>  Mame's CXR is back to basely COPD, NAD- we discussed setting up Full PFTs to check lung volumes and DLCO  ~  December 26, 2015:  49mo ROV w/ FullPFTs today> Amber Chaney indicates that her breathing is stable, no change overall; however she has developed a "tickle" in her throat and of interest she indicates that she has been Dx w. HBP by her new PCP- [place], NP-- placed on  Lisinopril & amlodipine;  I discussed w/ pt how the Lisinopril can coause this tickle & a cough 7 we decided to change to [facility] & she will f/u w/ her PCP... We reviewed the following medical problems during today's office visit >>     AR> on Claritin10 & Nasalide as needed...    COPD/emphysema> on Symbicort160-2spBid, Spiriva daily, NEBS w/ Albut vs HFA prn    Hx pneumonia> she was [facility] w/ COPD exac +FluA titer but norm CBC and no infiltrate on CXR; treated w/ Tamiflu,  Levaquin, Soulmedrol=>Pred & improved...    Ex-smoker> she quit smoking in 2014 w/ a 40-50 pack yr smoking hx.    Medical issues> recent Dx of HBP- started on Amlod5, Lisin10; on Omega-3 Fish Oil, VitD- 50K weekly; she has Osteopenia w/ BMD 4/16 showing Tscore -2.4 in Lspine; she developed Lisin side effect & we changed Lisin10 to [id number] & she will f/u w/ PCP... EXAM shows Afeb, VSS w/ BP=144/80, O2sat=97% on RA at rest;  HEENT- neg, mallampati1;  Chest- sl decr BS at bases but clear w/o w/r/r;  Heart- RR w/ gr1/6SEM no r/g;  Abd- soft, neg;  Ext- neg w/o c/c/e;  Neuro- intact...  FullPFTs 12/26/15>  FVC=2.15 (78%), FEV1=1.00 (47%), %1sec=46, mid-flows reduced at 22% predicted;  FEV1 improved to 1.15 (15%) after bronchodil;  TLC=4.31 (95%), RV=1.79 (93%), RV/TLC=42% and DLCO=75% predicted...  This shows a severe obstructive ventilatory defect w/ some emphysema and a small reversible component... IMP/PLAN>>  Mardell has severe COPD w/ and emphysematous component AND a reversible component;  REC to continue regular dosing of Symbicort160-2spBid, Spiriva daily, and a regular exercise program;  We are stopping her Lisinopril in favor of LOSARTAN50 & she will f/u w/ her PCP... We plan rov recheck in 87mo.  ~  June 26, 2016:  54mo ROV & Enyla tells me that her PCP switched the spiriva to Incruse & she likes this better, stating that the spiriva made her thirsty & she was drinking too much water w/ resulting low sodium, once she  switched to the Incruse the sodium improved;  Regimen includes> Symbicort160-2spBid, Incruse once daily, Albut Nebs & HFA as needed + Claritin/ Nasalide... She notes that her breathing is OK w/ min cough & no tickle sensation now, no sput or hemoptysis, no CP, & she is not smoking; she has chr stable DOE & still not exercising regularly "I do yard work" she says... She is asked to check w/ her PCP Suzi Roots Smothers NP at [facility] [place] [place] area) to be sure she is up to date on vaccinations...    She saw PCP- NP-Smothers 06/13/16>  Notes 2-3 cans of beer/ night; BP controlled on Amlod5, Losar50; she needs to elim the BEER to correct the sl hyponatremia  We reviewed the following medical problems during today's office visit>      AR> on Claritin10 & Nasalide as needed...    COPD/emphysema> on Symbicort160-2spBid, INCRUSE daily, NEBS w/ Albut vs HFA prn    Hx pneumonia> she was [facility] w/ COPD exac +FluA titer but norm CBC and no infiltrate on CXR; treated w/ Tamiflu, Levaquin, Soulmedrol=>Pred & improved...    Ex-smoker> she quit smoking in 2014 w/ a 40-50 pack yr smoking hx.    Medical issues> HBP- on Amlod5, Losar50; on Omega-3 Fish Oil, VitD- 50K weekly; she has Osteopenia w/ BMD 4/16 showing Tscore -2.4 in Lspine; she developed ACE side effect & we changed Lisin10 to Kinney- improved. EXAM shows Afeb, VSS w/ BP=128/64, O2sat=97% on RA at rest;  HEENT- neg, mallampati1;  Chest- sl decr BS at bases but clear w/o w/r/r;  Heart- RR w/ gr1/6SEM no r/g;  Abd- soft, neg;  Ext- neg w/o c/c/e;  Neuro- intact...  CXR 06/26/16 (independently reviewed by me in the PACS system)>  Norm heart size, aortic calcif, hyperinflation w/ flattened diaphragms, clear lungs w/o lesions noted- NAD, capsular calcif in breast implants...  LABS in Care Everywhere> 06/13/16-- [facility]- ok x [id number] IMP/PLAN>>  Kateri is stable w/ her severe COPD/emphysema,  last FEV1=1000cc, on triple drug therapy & asked to increase  her exercise program, consider Pulm rehab, avoid infections, etc...   ~  January 17, 2017:  36mo ROV & pulmonary follow up visit>  Smt returns for pulmonary follow up having had a good interval- doing satis, breathing well with good days and bad days, SOB/DOE w/o change, occas feels tight, exercise by walking; she notes min cough, small amt clear phlegm, no hemoptysis, no CP, etc... She reports check up w/ her non-[facility] PCP in [id number] w/ "labs and shots" reports doing well- no notes avail to review...  We reviewed the following medical problems during today's office visit>      AR> on Claritin10 & Nasalide as needed...    COPD/emphysema> on Symbicort160-2spBid, INCRUSE daily, NEBS w/ Albut vs HFA prn    Hx pneumonia> she was [facility] w/ COPD exac +FluA titer but norm CBC and no infiltrate on CXR; treated w/ Tamiflu, Levaquin, Soulmedrol=>Pred & improved...    Ex-smoker> she quit smoking in 2014 w/ a 40-50 pack yr smoking hx.    Medical issues> HBP- on Amlod5, Losar50; on Omega-3 Fish Oil, VitD- 50K weekly; she has Osteopenia w/ BMD 4/16 showing Tscore -2.4 in Lspine; she developed ACE side effect & we changed Lisin10 to Neche- improved. EXAM shows Afeb, VSS w/ BP=128/64, O2sat=97% on RA at rest;  HEENT- neg, mallampati1;  Chest- sl decr BS at bases but clear w/o w/r/r;  Heart- RR w/ gr1/6SEM no r/g;  Abd- soft, neg;  Ext- neg w/o c/c/e;  Neuro- intact... IMP/PLAN>>  Tonjia is stable w/ her GOLD Stage 2-3 COPD on triple therapy w/ Symbicort & Incruse=> continue same + exercise program, continue to avoid infections, etc... She will call for any problems otherw plan rov in 6-81mo...     Past Medical History:  Diagnosis Date  . Allergic rhinitis   . COPD (chronic obstructive pulmonary disease) ([place])     Past Surgical History:  Procedure Laterality Date  . APPENDECTOMY    . BREAST SURGERY    . TONSILLECTOMY    . 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 albuterol (PROVENTIL) (2.5 MG/3ML) 0.083% nebulizer solution USE 1 VIAL VIA NEBULIZER EVERY 6 HOURS AS NEEDED  . amLODipine (NORVASC) 5 MG tablet Take 1 tablet by mouth daily.  . flunisolide (NASALIDE) 25 MCG/ACT (0.025%) SOLN USE 1 SPRAY IN EACH NOSTRIL TWICE DAILY  . loratadine (CLARITIN) 10 MG tablet Take 10 mg by mouth daily.  Marland Kitchen losartan (COZAAR) 50 MG tablet Take 1 tablet (50 mg total) by mouth daily.  Marland Kitchen umeclidinium bromide (INCRUSE ELLIPTA) 62.5 MCG/INH AEPB Inhale 1 puff into the lungs daily.  . Vitamin D, Ergocalciferol, (DRISDOL) 50000 UNITS CAPS Take 50,000 Units by mouth every 7 (seven) days.   . [DISCONTINUED] budesonide-formoterol (SYMBICORT) 160-4.5 MCG/ACT inhaler Inhale 2 puffs into the lungs 2 (two) times daily.  . [DISCONTINUED] PROAIR HFA 108 (90 Base) MCG/ACT inhaler INHALE 1-2 PUFFS INTO THE LUNGS EVERY 6 HOURS AS NEEDED FOR WHEEZING OR SHORTNESS OF BREATH  . [DISCONTINUED] SYMBICORT 160-4.5 MCG/ACT inhaler INHALE 2 PUFFS INTO THE LUNGS TWICE DAILY  . [DISCONTINUED] umeclidinium bromide (INCRUSE ELLIPTA) 62.5 MCG/INH AEPB Inhale 1 puff into the lungs daily.   No facility-administered encounter medications on file as of 01/17/2017.     Allergies  Allergen Reactions  . A-G Pro  Shots cause tumors in arms  . Eggs Or Egg-Derived Products Other (See Comments)    Just the flu shot (pattient can eat eggs)    Immunization History  Administered Date(s) Administered  . Influenza,inj,Quad PF,6+ Mos 02/28/2016  . Influenza,inj,Quad PF,6-35 Mos 11/15/2016  . Pneumococcal Conjugate-13 08/02/2015, 11/15/2016  . Pneumococcal Polysaccharide-23 03/14/2010  . Tdap 04/20/2014  . Zoster 08/16/2015  Pt indicates that she's received all indicated vaccines at PCP office...   Current Medications, Allergies, Past Medical History, Past Surgical History, Family History, and Social History were reviewed in [employer] record.   Review of Systems  Constitutional: Negative for fever and unexpected weight change.  HENT: Positive for congestion, postnasal drip, rhinorrhea and sinus pressure. Negative for dental problem, ear pain, nosebleeds, sneezing, sore throat and trouble swallowing.   Eyes: Negative for redness and itching.  Respiratory: Positive for cough. Negative for chest tightness, shortness of breath and wheezing.   Cardiovascular: Negative for palpitations and leg swelling.  Gastrointestinal: Negative for nausea and vomiting.  Genitourinary: Negative for dysuria.  Musculoskeletal: Negative for joint swelling.  Skin: Negative for rash.  Neurological: Negative for headaches.  Hematological: Does not bruise/bleed easily.  Psychiatric/Behavioral: Negative for dysphoric mood. The patient is not nervous/anxious.        Objective:   Physical Exam  Well-developed female in no acute distress Nose without purulence or discharge noted Neck without lymphadenopathy or thyromegaly Chest with decreased breath sounds, no active wheezes or crackles Cardiac exam with regular rate and rhythm Lower extremities without edema, no cyanosis Alert and oriented, moves all 4 extremities.     Assessment & Plan:    IMP >>     COPD, emphysema => PFTs 12/17 showed severe airflow obstruction w/ DLCO=75% predicted and a 15% improvement in FEV1 after bronchodil    Allergic Rhinitis    Ex-smoker    Medical Issues>  HBP, Osteopenia, Vit D defic...  PLAN >>  10/20/14>   Safiyah appears stable on her current rx- we discussed f/u CXR & FullPFTs but will put this off til the spring; ROV in 10mo 04/20/15>   We refilled her Albut for NEB w/ instructions to use it Q6h prn;  Reminded of the importance of MUCINEX 600mg - 2Bid w/ fluids for the thick phlegm;  Finish the Pred taper & return in 6wks for f/u CXR and to sched FullPFTs. 06/23/15>   She is back to baseline & CXR clear/ COPD- NAD; rec to continue meds,  regular exercise, check FullPFTs... 12/26/15>   Cathryn has severe COPD w/ and emphysematous component AND a reversible component;  REC to continue regular dosing of Symbicort160-2spBid, Spiriva daily, and a regular exercise program;  We are stopping her Lisinopril in favor of LOSARTAN50 & she will f/u w/ her PCP... We plan rov recheck in 30mo 06/26/16>   Darcia is stable w/ her severe COPD/emphysema, last FEV1=1000cc, on triple drug therapy & asked to increase her exercise program, consider Pulm rehab, avoid infections, etc... 01/17/17>   Kosha is stable w/ her GOLD Stage 2-3 COPD on triple therapy w/ Symbicort & Incruse=> continue same + exercise program, continue to avoid infections, etc... She will call for any problems otherw plan rov in 6-40mo   Patient's Medications  New Prescriptions   No medications on file  Previous Medications   ALBUTEROL (PROVENTIL) (2.5 MG/3ML) 0.083% NEBULIZER SOLUTION    USE 1 VIAL VIA NEBULIZER EVERY 6 HOURS AS NEEDED   AMLODIPINE (NORVASC) 5 MG TABLET  Take 1 tablet by mouth daily.   FLUNISOLIDE (NASALIDE) 25 MCG/ACT (0.025%) SOLN    USE 1 SPRAY IN EACH NOSTRIL TWICE DAILY   LORATADINE (CLARITIN) 10 MG TABLET    Take 10 mg by mouth daily.   LOSARTAN (COZAAR) 50 MG TABLET    Take 1 tablet (50 mg total) by mouth daily.   VITAMIN D, ERGOCALCIFEROL, (DRISDOL) 50000 UNITS CAPS    Take 50,000 Units by mouth every 7 (seven) days.   Modified Medications   Modified Medication Previous Medication   ALBUTEROL (PROVENTIL HFA;VENTOLIN HFA) 108 (90 BASE) MCG/ACT INHALER PROAIR HFA 108 (90 Base) MCG/ACT inhaler      INHALE 1-2 PUFFS INTO THE LUNGS EVERY 6 HOURS AS NEEDED FOR WHEEZING OR SHORTNESS OF BREATH    INHALE 1-2 PUFFS INTO THE LUNGS EVERY 6 HOURS AS NEEDED FOR WHEEZING OR SHORTNESS OF BREATH   SYMBICORT 160-4.5 MCG/ACT INHALER SYMBICORT 160-4.5 MCG/ACT inhaler      INHALE 2 PUFFS INTO THE LUNGS TWICE DAILY    INHALE 2 PUFFS INTO THE LUNGS TWICE DAILY    UMECLIDINIUM BROMIDE (INCRUSE ELLIPTA) 62.5 MCG/INH AEPB umeclidinium bromide (INCRUSE ELLIPTA) 62.5 MCG/INH AEPB      Inhale 1 puff into the lungs daily.    Inhale 1 puff into the lungs daily.  Discontinued Medications   BUDESONIDE-FORMOTEROL (SYMBICORT) 160-4.5 MCG/ACT INHALER    Inhale 2 puffs into the lungs 2 (two) times daily.   PROAIR HFA 108 (90 BASE) MCG/ACT INHALER    INHALE 1-2 PUFFS INTO THE LUNGS EVERY 6 HOURS AS NEEDED FOR WHEEZING OR SHORTNESS OF BREATH   SYMBICORT 160-4.5 MCG/ACT INHALER    INHALE 2 PUFFS INTO THE LUNGS TWICE DAILY

## 2017-08-09 ENCOUNTER — Other Ambulatory Visit: Payer: Self-pay | Admitting: Pulmonary Disease

## 2017-08-23 ENCOUNTER — Other Ambulatory Visit: Payer: Self-pay | Admitting: Pulmonary Disease

## 2017-09-17 ENCOUNTER — Other Ambulatory Visit: Payer: Self-pay | Admitting: Pulmonary Disease

## 2017-10-14 ENCOUNTER — Other Ambulatory Visit: Payer: Self-pay | Admitting: Pulmonary Disease

## 2017-11-21 ENCOUNTER — Other Ambulatory Visit: Payer: Self-pay | Admitting: Pulmonary Disease

## 2017-11-22 NOTE — Pre-Procedure Instructions (Signed)
Amber Chaney  11/22/2017      Your procedure is scheduled on Friday, November 15.  Report to [facility] Admitting at 9:00 AM                   Your surgery or procedure is scheduled for 11:00 A.M.   Call this number if you have problems the morning of surgery: [phone]  This is the number for the Pre- Surgical Desk.    Remember:  Do not eat or drink after midnight Thursday, November 14.   Take these medicines the morning of surgery with A SIP OF WATER: INCRUSE ELLIPTA inhaler SYMBICORT   Take if needed: albuterol (PROVENTIL)  Nebulizer or inhaler- bring Inhaler to the hospital with you. Nasal Spray loratadine (CLARITIN)   STOP taking Aspirin, Aspirin Products (Goody Powder, Excedrin Migraine), Ibuprofen (Advil), Naproxen (Aleve), Vitamins and Herbal Products (ie Fish Oil)  Special instructions:   [place]- Preparing For Surgery  Before surgery, you can play an important role. Because skin is not sterile, your skin needs to be as free of germs as possible. You can reduce the number of germs on your skin by washing with CHG (chlorahexidine gluconate) Soap before surgery.  CHG is an antiseptic cleaner which kills germs and bonds with the skin to continue killing germs even after washing.    Oral Hygiene is also important to reduce your risk of infection.  Remember - BRUSH YOUR TEETH THE MORNING OF SURGERY WITH YOUR REGULAR TOOTHPASTE  Please do not use if you have an allergy to CHG or antibacterial soaps. If your skin becomes reddened/irritated stop using the CHG.  Do not shave (including legs and underarms) for at least 48 hours prior to first CHG shower. It is OK to shave your face.  Please follow these instructions carefully.   1. Shower the NIGHT BEFORE SURGERY and the MORNING OF SURGERY with CHG.   2. If you chose to wash your hair, wash your hair first as usual with your normal shampoo.  3. After you shampoo, wash your face and private area with  the soap you use at home, then rinse your hair and body thoroughly to remove the shampoo and shampoo.  4. Use CHG as you would any other liquid soap. You can apply CHG directly to the skin and wash gently with a scrungie or a clean washcloth.   5. Apply the CHG Soap to your body ONLY FROM THE NECK DOWN.  Do not use on open wounds or open sores. Avoid contact with your eyes, ears, mouth and genitals (private parts).   6. Wash thoroughly, paying special attention to the area where your surgery will be performed.  7. Thoroughly rinse your body with warm water from the neck down.  8. DO NOT shower/wash with your normal soap after using and rinsing off the CHG Soap.  9. Pat yourself dry with a CLEAN TOWEL.  10. Wear CLEAN PAJAMAS to bed the night before surgery, wear comfortable clothes the morning of surgery  11. Place CLEAN SHEETS on your bed the night of your first shower and DO NOT SLEEP WITH PETS.   Day of Surgery:  Shower as above Do not apply any deodorants/lotions.  Please wear clean clothes to the hospital/surgery center.   Remember to brush your teeth WITH YOUR REGULAR TOOTHPASTE.             Do not wear jewelry, make-up or nail polish, powders or colognes.Marland Kitchen  Do not shave 48 hours prior to surgery.    Do not bring valuables to the hospital.  [facility] is not responsible for any belongings or valuables.  Contacts, dentures or bridgework may not be worn into surgery.  Leave your suitcase in the car.  After surgery it may be brought to your room.  For patients admitted to the hospital, discharge time will be determined by your treatment team.  Patients discharged the day of surgery will not be allowed to drive home.   Please read over the following fact sheets that you were given: Coughing and Deep Breathing. And Deep Breathing and Surgical Site Infections

## 2017-11-25 ENCOUNTER — Other Ambulatory Visit: Payer: Self-pay

## 2017-11-25 ENCOUNTER — Encounter (HOSPITAL_COMMUNITY): Payer: Self-pay

## 2017-11-25 ENCOUNTER — Encounter (HOSPITAL_COMMUNITY)
Admission: RE | Admit: 2017-11-25 | Discharge: 2017-11-25 | Disposition: A | Discharge status: Home / Self Care | Payer: Medicare Other | Source: Ambulatory Visit | Attending: Otolaryngology | Admitting: Otolaryngology

## 2017-11-25 DIAGNOSIS — Z01818 Encounter for other preprocedural examination: Secondary | ICD-10-CM | POA: Diagnosis not present

## 2017-11-25 DIAGNOSIS — I10 Essential (primary) hypertension: Secondary | ICD-10-CM | POA: Diagnosis not present

## 2017-11-25 DIAGNOSIS — I491 Atrial premature depolarization: Secondary | ICD-10-CM | POA: Insufficient documentation

## 2017-11-25 HISTORY — DX: Pneumonia, unspecified organism: J18.9

## 2017-11-25 HISTORY — DX: Essential (primary) hypertension: I10

## 2017-11-25 HISTORY — DX: Malignant (primary) neoplasm, unspecified: C80.1

## 2017-11-25 LAB — CBC
HEMATOCRIT: 42.4 % (ref 36.0–46.0)
Hemoglobin: 13.9 g/dL (ref 12.0–15.0)
MCH: 33.5 pg (ref 26.0–34.0)
MCHC: 32.8 g/dL (ref 30.0–36.0)
MCV: 102.2 fL — ABNORMAL HIGH (ref 80.0–100.0)
Platelets: 295 10*3/uL (ref 150–400)
RBC: 4.15 MIL/uL (ref 3.87–5.11)
RDW: 11.9 % (ref 11.5–15.5)
WBC: 9.9 10*3/uL (ref 4.0–10.5)
nRBC: 0 % (ref 0.0–0.2)

## 2017-11-25 LAB — BASIC METABOLIC PANEL
Anion gap: 9 (ref 5–15)
BUN: 7 mg/dL — ABNORMAL LOW (ref 8–23)
CALCIUM: 9.5 mg/dL (ref 8.9–10.3)
CO2: 23 mmol/L (ref 22–32)
CREATININE: 0.69 mg/dL (ref 0.44–1.00)
Chloride: 97 mmol/L — ABNORMAL LOW (ref 98–111)
GFR calc non Af Amer: >60 mL/min (ref >60)
GLUCOSE: 91 mg/dL (ref 70–99)
Potassium: 4.1 mmol/L (ref 3.5–5.1)
Sodium: 129 mmol/L — ABNORMAL LOW (ref 135–145)

## 2017-11-26 NOTE — H&P (Signed)
Otolaryngology Clinic Note  HPI:    Amber Chaney is a 67 y.o. female patient of Tammy Arn Medal, MD for preoperative evaluation for right submandibular gland excision with frozen section, possible functional neck dissection.  There has been no change in the size or symptoms of the neck mass.  Otherwise she is basically doing well.  PMH/Meds/All/SocHx/FamHx/ROS:   PastMedicalHistory      Past Medical History:  Diagnosis Date  . Basal cell carcinoma of skin   . Chronic airway obstruction ([place])   . Disorder of bone and cartilage   . Essential hypertension       PastSurgicalHistory       Past Surgical History:  Procedure Laterality Date  . APPENDECTOMY    . TONSILLECTOMY    . TUMOR EXCISION        No family history of bleeding disorders, wound healing problems or difficulty with anesthesia.   SocialHistory  Social History        Socioeconomic History  . Marital status: Married    Spouse name: Not on file  . Number of children: 1  . Years of education: 31  . Highest education level: Not on file  Occupational History  . Occupation: RETIRED  Social Needs  . Financial resource strain: Not on file  . Food insecurity:    Worry: Not on file    Inability: Not on file  . Transportation needs:    Medical: Not on file    Non-medical: Not on file  Tobacco Use  . Smoking status: Former Research scientist (life sciences)  . Smokeless tobacco: Never Used  Substance and Sexual Activity  . Alcohol use: Yes    Alcohol/week: 8.4 - 12.6 oz    Types: 14 - 21 Cans of beer per week  . Drug use: No  . Sexual activity: Yes    Partners: Male  Lifestyle  . Physical activity:    Days per week: Not on file    Minutes per session: Not on file  . Stress: Not on file  Relationships  . Social connections:    Talks on phone: Not on file    Gets together: Not on file    Attends religious service: Not on file    Active member of club or organization: Not  on file    Attends meetings of clubs or organizations: Not on file    Relationship status: Not on file  Other Topics Concern  . Not on file  Social History Narrative  . Not on file       Current Outpatient Medications:  .  albuterol 2.5 mg /3 mL (0.083 %) nebulizer solution, Take 1 Dose by nebulization daily as needed., Disp: , Rfl:  .  albuterol 90 mcg/actuation inhaler, Inhale 2 puffs into the lungs every 6 (six) hours as needed., Disp: , Rfl:  .  amLODIPine (NORVASC) 5 MG tablet, TAKE 1 TABLET BY MOUTH EVERY DAY, Disp: 90 tablet, Rfl: 0 .  budesonide-formoterol (SYMBICORT) 160-4.5 mcg/actuation inhaler, Take 1 puff by mouth daily., Disp: , Rfl:  .  cyclobenzaprine (FLEXERIL) 10 MG tablet, cyclobenzaprine 10 mg tablet, Disp: , Rfl:  .  ergocalciferol (VITAMIN D2) 50,000 unit capsule, Take 1 capsule by mouth once a week., Disp: , Rfl: 11 .  flunisolide (NASALIDE) 25 mcg (0.025 %) Spry, 1 spray by Nasal route 2 times daily., Disp: , Rfl:  .  loratadine (CLARITIN) 10 mg tablet, Take 10 mg by mouth daily., Disp: , Rfl:  .  losartan (COZAAR) 50 MG  tablet, TAKE 1 TABLET BY MOUTH EVERY DAY, Disp: 90 tablet, Rfl: 0 .  umeclidinium (INCRUSE ELLIPTA) 62.5 mcg/actuation inhaler, Inhale 1 puff into the lungs daily., Disp: , Rfl:  .  umeclidinium (INCRUSE ELLIPTA) 62.5 mcg/actuation inhaler, INHALE 1 PUFF INTO THE LUNGS DAILY, Disp: , Rfl:   A complete ROS was performed with pertinent positives/negatives noted in the HPI. The remainder of the ROS are negative.    Physical Exam:    There were no vitals taken for this visit. She is thin and energetic.  The anterior floor of the mouth shows surgical changes.  There is a firm mass consistent with a right submandibular gland but no identified additional adenopathy. Lungs: Clear to auscultation Heart: Regular rate and rhythm without murmurs Abdomen: Soft, active Extremities: Normal configuration Neurologic: Symmetric, grossly  intact.      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  Plan: We will proceed with submandibular gland excision, frozen section, possible functional neck dissection.  I will see her back here in the office 2 weeks postop.  I discussed the surgery in detail the risks and complications.  Questions were answered and informed consent was obtained.  I offered prescriptions for hydrocodone for postoperative pain relief which she declined.   Lilyan Gilford, MD  [id number]

## 2017-11-29 ENCOUNTER — Ambulatory Visit (HOSPITAL_COMMUNITY): Payer: Medicare Other | Admitting: Anesthesiology

## 2017-11-29 ENCOUNTER — Encounter (HOSPITAL_COMMUNITY): Payer: Self-pay | Admitting: *Deleted

## 2017-11-29 ENCOUNTER — Observation Stay (HOSPITAL_COMMUNITY)
Admission: RE | Admit: 2017-11-29 | Discharge: 2017-11-30 | Disposition: A | Discharge status: Home / Self Care | Payer: Medicare Other | Source: Ambulatory Visit | Attending: Otolaryngology | Admitting: Otolaryngology

## 2017-11-29 ENCOUNTER — Encounter (HOSPITAL_COMMUNITY)
Admission: RE | Disposition: A | Discharge status: Home / Self Care | Payer: Self-pay | Source: Ambulatory Visit | Attending: Otolaryngology

## 2017-11-29 DIAGNOSIS — J449 Chronic obstructive pulmonary disease, unspecified: Secondary | ICD-10-CM | POA: Insufficient documentation

## 2017-11-29 DIAGNOSIS — Z87891 Personal history of nicotine dependence: Secondary | ICD-10-CM | POA: Diagnosis not present

## 2017-11-29 DIAGNOSIS — Z79899 Other long term (current) drug therapy: Secondary | ICD-10-CM | POA: Insufficient documentation

## 2017-11-29 DIAGNOSIS — Z85828 Personal history of other malignant neoplasm of skin: Secondary | ICD-10-CM | POA: Insufficient documentation

## 2017-11-29 DIAGNOSIS — I10 Essential (primary) hypertension: Secondary | ICD-10-CM | POA: Diagnosis not present

## 2017-11-29 DIAGNOSIS — K1123 Chronic sialoadenitis: Principal | ICD-10-CM | POA: Insufficient documentation

## 2017-11-29 DIAGNOSIS — K1120 Sialoadenitis, unspecified: Secondary | ICD-10-CM | POA: Diagnosis present

## 2017-11-29 HISTORY — PX: SUBMANDIBULAR GLAND EXCISION: SHX2456

## 2017-11-29 SURGERY — EXCISION, SUBMANDIBULAR GLAND
Anesthesia: General | Site: Neck | Laterality: Right

## 2017-11-29 MED ORDER — SUCCINYLCHOLINE CHLORIDE 200 MG/10ML IV SOSY
PREFILLED_SYRINGE | INTRAVENOUS | Status: AC
  Filled 2017-11-29: qty 10

## 2017-11-29 MED ORDER — FENTANYL CITRATE (PF) 250 MCG/5ML IJ SOLN
INTRAMUSCULAR | Status: AC
  Filled 2017-11-29: qty 5

## 2017-11-29 MED ORDER — ONDANSETRON HCL 4 MG/2ML IJ SOLN: 4.0000 mg | INTRAMUSCULAR | Status: DC | PRN

## 2017-11-29 MED ORDER — CHLORHEXIDINE GLUCONATE CLOTH 2 % EX PADS: 6.0000 | MEDICATED_PAD | Freq: Once | CUTANEOUS | Status: DC

## 2017-11-29 MED ORDER — PHENYLEPHRINE 40 MCG/ML (10ML) SYRINGE FOR IV PUSH (FOR BLOOD PRESSURE SUPPORT)
PREFILLED_SYRINGE | INTRAVENOUS | Status: DC | PRN
  Administered 2017-11-29: 120 ug via INTRAVENOUS
  Administered 2017-11-29: 40 ug via INTRAVENOUS
  Administered 2017-11-29 (×2): 120 ug via INTRAVENOUS

## 2017-11-29 MED ORDER — LIDOCAINE-EPINEPHRINE 1 %-1:100000 IJ SOLN
INTRAMUSCULAR | Status: AC
  Filled 2017-11-29: qty 1

## 2017-11-29 MED ORDER — SUCCINYLCHOLINE CHLORIDE 20 MG/ML IJ SOLN
INTRAMUSCULAR | Status: DC | PRN
  Administered 2017-11-29: 40 mg via INTRAVENOUS

## 2017-11-29 MED ORDER — PROPOFOL 10 MG/ML IV BOLUS
INTRAVENOUS | Status: AC
  Filled 2017-11-29: qty 20

## 2017-11-29 MED ORDER — ONDANSETRON HCL 4 MG PO TABS: 4.0000 mg | ORAL_TABLET | ORAL | Status: DC | PRN

## 2017-11-29 MED ORDER — LIDOCAINE 2% (20 MG/ML) 5 ML SYRINGE
INTRAMUSCULAR | Status: AC
  Filled 2017-11-29: qty 5

## 2017-11-29 MED ORDER — OXYCODONE HCL 5 MG PO TABS: 5.0000 mg | ORAL_TABLET | Freq: Once | ORAL | Status: DC | PRN

## 2017-11-29 MED ORDER — MIDAZOLAM HCL 5 MG/5ML IJ SOLN
INTRAMUSCULAR | Status: DC | PRN
  Administered 2017-11-29: 2 mg via INTRAVENOUS

## 2017-11-29 MED ORDER — ONDANSETRON HCL 4 MG/2ML IJ SOLN
INTRAMUSCULAR | Status: DC | PRN
  Administered 2017-11-29: 4 mg via INTRAVENOUS

## 2017-11-29 MED ORDER — PROPOFOL 10 MG/ML IV BOLUS
INTRAVENOUS | Status: DC | PRN
  Administered 2017-11-29: 100 mg via INTRAVENOUS
  Administered 2017-11-29: 30 mg via INTRAVENOUS

## 2017-11-29 MED ORDER — DEXAMETHASONE SODIUM PHOSPHATE 10 MG/ML IJ SOLN
INTRAMUSCULAR | Status: DC | PRN
  Administered 2017-11-29: 10 mg via INTRAVENOUS

## 2017-11-29 MED ORDER — CEFAZOLIN SODIUM-DEXTROSE 2-4 GM/100ML-% IV SOLN
INTRAVENOUS | Status: AC
  Filled 2017-11-29: qty 100

## 2017-11-29 MED ORDER — HYDROCODONE-ACETAMINOPHEN 5-325 MG PO TABS: 1.0000 | ORAL_TABLET | ORAL | Status: DC | PRN

## 2017-11-29 MED ORDER — LACTATED RINGERS IV SOLN
INTRAVENOUS | Status: DC
  Administered 2017-11-29 (×2): via INTRAVENOUS

## 2017-11-29 MED ORDER — LIDOCAINE-EPINEPHRINE 1 %-1:100000 IJ SOLN
INTRAMUSCULAR | Status: DC | PRN
  Administered 2017-11-29: 5 mL

## 2017-11-29 MED ORDER — SODIUM CHLORIDE 0.9 % IV SOLN
INTRAVENOUS | Status: DC | PRN
  Administered 2017-11-29: 25 ug/min via INTRAVENOUS

## 2017-11-29 MED ORDER — CEFAZOLIN SODIUM-DEXTROSE 2-4 GM/100ML-% IV SOLN
2.0000 g | Freq: Once | INTRAVENOUS | Status: AC
  Administered 2017-11-29: 2 g via INTRAVENOUS

## 2017-11-29 MED ORDER — ALBUTEROL SULFATE HFA 108 (90 BASE) MCG/ACT IN AERS
INHALATION_SPRAY | RESPIRATORY_TRACT | Status: AC
  Filled 2017-11-29: qty 6.7

## 2017-11-29 MED ORDER — MIDAZOLAM HCL 2 MG/2ML IJ SOLN
INTRAMUSCULAR | Status: AC
  Filled 2017-11-29: qty 2

## 2017-11-29 MED ORDER — HYDROMORPHONE HCL 1 MG/ML IJ SOLN: 0.2500 mg | INTRAMUSCULAR | Status: DC | PRN

## 2017-11-29 MED ORDER — ALBUTEROL SULFATE HFA 108 (90 BASE) MCG/ACT IN AERS
INHALATION_SPRAY | RESPIRATORY_TRACT | Status: DC | PRN
  Administered 2017-11-29: 5 via RESPIRATORY_TRACT

## 2017-11-29 MED ORDER — OXYCODONE HCL 5 MG/5ML PO SOLN: 5.0000 mg | Freq: Once | ORAL | Status: DC | PRN

## 2017-11-29 MED ORDER — IBUPROFEN 100 MG/5ML PO SUSP
400.0000 mg | Freq: Four times a day (QID) | ORAL | Status: DC | PRN
  Administered 2017-11-29 (×2): 400 mg via ORAL
  Filled 2017-11-29 (×5): qty 20

## 2017-11-29 MED ORDER — ARTIFICIAL TEARS OPHTHALMIC OINT
TOPICAL_OINTMENT | OPHTHALMIC | Status: AC
  Filled 2017-11-29: qty 3.5

## 2017-11-29 MED ORDER — PHENYLEPHRINE 40 MCG/ML (10ML) SYRINGE FOR IV PUSH (FOR BLOOD PRESSURE SUPPORT)
PREFILLED_SYRINGE | INTRAVENOUS | Status: AC
  Filled 2017-11-29: qty 10

## 2017-11-29 MED ORDER — CEFAZOLIN SODIUM-DEXTROSE 1-4 GM/50ML-% IV SOLN
1.0000 g | INTRAVENOUS | Status: DC
  Filled 2017-11-29: qty 50

## 2017-11-29 MED ORDER — DEXAMETHASONE SODIUM PHOSPHATE 10 MG/ML IJ SOLN
INTRAMUSCULAR | Status: AC
  Filled 2017-11-29: qty 1

## 2017-11-29 MED ORDER — BACITRACIN ZINC 500 UNIT/GM EX OINT
TOPICAL_OINTMENT | CUTANEOUS | Status: AC
  Filled 2017-11-29: qty 28.35

## 2017-11-29 MED ORDER — PROMETHAZINE HCL 25 MG/ML IJ SOLN: 6.2500 mg | INTRAMUSCULAR | Status: DC | PRN

## 2017-11-29 MED ORDER — DEXTROSE-NACL 5-0.45 % IV SOLN
INTRAVENOUS | Status: DC
  Administered 2017-11-29: 16:00:00 via INTRAVENOUS

## 2017-11-29 MED ORDER — LIDOCAINE 2% (20 MG/ML) 5 ML SYRINGE
INTRAMUSCULAR | Status: DC | PRN
  Administered 2017-11-29: 50 mg via INTRAVENOUS

## 2017-11-29 MED ORDER — FENTANYL CITRATE (PF) 100 MCG/2ML IJ SOLN
INTRAMUSCULAR | Status: DC | PRN
  Administered 2017-11-29: 150 ug via INTRAVENOUS
  Administered 2017-11-29 (×2): 50 ug via INTRAVENOUS

## 2017-11-29 MED ORDER — ONDANSETRON HCL 4 MG/2ML IJ SOLN
INTRAMUSCULAR | Status: AC
  Filled 2017-11-29: qty 2

## 2017-11-29 SURGICAL SUPPLY — 51 items
ADH SKN CLS APL DERMABOND .7 (GAUZE/BANDAGES/DRESSINGS) ×1
ATTRACTOMAT 16X20 MAGNETIC DRP (DRAPES) ×1 IMPLANT
BLADE CLIPPER SURG (BLADE) IMPLANT
BLADE SURG 15 STRL LF DISP TIS (BLADE) IMPLANT
BLADE SURG 15 STRL SS (BLADE) ×2
CANISTER SUCT 3000ML PPV (MISCELLANEOUS) ×2 IMPLANT
CLEANER TIP ELECTROSURG 2X2 (MISCELLANEOUS) ×2 IMPLANT
CONT SPEC 4OZ CLIKSEAL STRL BL (MISCELLANEOUS) ×2 IMPLANT
CORDS BIPOLAR (ELECTRODE) IMPLANT
COVER SURGICAL LIGHT HANDLE (MISCELLANEOUS) ×2 IMPLANT
COVER WAND RF STERILE (DRAPES) ×2 IMPLANT
CRADLE DONUT ADULT HEAD (MISCELLANEOUS) ×1 IMPLANT
DERMABOND ADVANCED (GAUZE/BANDAGES/DRESSINGS) ×1
DERMABOND ADVANCED .7 DNX12 (GAUZE/BANDAGES/DRESSINGS) IMPLANT
DRAIN SNY 10 ROU (WOUND CARE) ×2 IMPLANT
DRAPE HALF SHEET 40X57 (DRAPES) ×1 IMPLANT
ELECT COATED BLADE 2.86 ST (ELECTRODE) ×2 IMPLANT
ELECT REM PT RETURN 9FT ADLT (ELECTROSURGICAL) ×2
ELECTRODE REM PT RTRN 9FT ADLT (ELECTROSURGICAL) ×1 IMPLANT
EVACUATOR SILICONE 100CC (DRAIN) IMPLANT
GAUZE 4X4 16PLY RFD (DISPOSABLE) IMPLANT
GAUZE SPONGE 2X2 8PLY STRL LF (GAUZE/BANDAGES/DRESSINGS) IMPLANT
GLOVE BIOGEL PI IND STRL 7.0 (GLOVE) IMPLANT
GLOVE BIOGEL PI INDICATOR 7.0 (GLOVE) ×3
GLOVE ECLIPSE 8.0 STRL XLNG CF (GLOVE) ×2 IMPLANT
GOWN STRL REUS W/ TWL LRG LVL3 (GOWN DISPOSABLE) ×2 IMPLANT
GOWN STRL REUS W/ TWL XL LVL3 (GOWN DISPOSABLE) ×1 IMPLANT
GOWN STRL REUS W/TWL LRG LVL3 (GOWN DISPOSABLE) ×6
GOWN STRL REUS W/TWL XL LVL3 (GOWN DISPOSABLE) ×2
KIT BASIN OR (CUSTOM PROCEDURE TRAY) ×2 IMPLANT
KIT TURNOVER KIT B (KITS) ×2 IMPLANT
LOCATOR NERVE 3 VOLT (DISPOSABLE) IMPLANT
NDL HYPO 25GX1X1/2 BEV (NEEDLE) ×1 IMPLANT
NEEDLE HYPO 25GX1X1/2 BEV (NEEDLE) ×2 IMPLANT
NS IRRIG 1000ML POUR BTL (IV SOLUTION) ×2 IMPLANT
PAD ARMBOARD 7.5X6 YLW CONV (MISCELLANEOUS) ×4 IMPLANT
PENCIL BUTTON HOLSTER BLD 10FT (ELECTRODE) ×2 IMPLANT
SLEEVE SCD COMPRESS KNEE MED (MISCELLANEOUS) ×1 IMPLANT
SPONGE GAUZE 2X2 STER 10/PKG (GAUZE/BANDAGES/DRESSINGS) ×1
STAPLER VISISTAT 35W (STAPLE) ×2 IMPLANT
SUT CHROMIC 4 0 PS 2 18 (SUTURE) IMPLANT
SUT ETHILON 3 0 PS 1 (SUTURE) ×3 IMPLANT
SUT ETHILON 5 0 PS 2 18 (SUTURE) IMPLANT
SUT SILK 0 TIES 10X30 (SUTURE) IMPLANT
SUT SILK 3 0 REEL (SUTURE) ×3 IMPLANT
SUT SILK 4 0 REEL (SUTURE) ×1 IMPLANT
SUT VIC AB 4-0 PS2 18 (SUTURE) ×2 IMPLANT
SYR CONTROL 10ML LL (SYRINGE) ×2 IMPLANT
TOWEL OR 17X24 6PK STRL BLUE (TOWEL DISPOSABLE) ×2 IMPLANT
TRAY ENT MC OR (CUSTOM PROCEDURE TRAY) ×2 IMPLANT
TUBE CONNECTING 12X1/4 (SUCTIONS) ×1 IMPLANT

## 2017-11-29 NOTE — Interval H&P Note (Signed)
History and Physical Interval Note:  11/29/2017 11:33 AM  Amber Chaney  has presented today for surgery, with the diagnosis of RIGHT SUBMANDIBULAR GLAND OBSTRUCTION  The various methods of treatment have been discussed with the patient and family. After consideration of risks, benefits and other options for treatment, the patient has consented to  Procedure(s): EXCISION RIGHT SUBMANDIBULAR GLAND, POSSIBLE LIMITED NECK DISSECTION (Right) as a surgical intervention .  The patient's history has been re-reviewed, patient re-examined, no change in status, stable for surgery.  I have re-reviewed the patient's chart and labs.  Questions were answered to the patient's satisfaction.     Ileene Hutchinson [facility]

## 2017-11-29 NOTE — Discharge Instructions (Signed)
Head elevated x 3-4 nights OK to shower No strenuous activity x 14 days Call for signs of bleeding or infection Recheck my office 2 weeks, [phone] for an appointment

## 2017-11-29 NOTE — Anesthesia Procedure Notes (Signed)
Procedure Name: Intubation Date/Time: 11/29/2017 11:52 AM Performed by: Jenne Campus, CRNA Pre-anesthesia Checklist: Patient identified, Emergency Drugs available, Suction available and Patient being monitored Patient Re-evaluated:Patient Re-evaluated prior to induction Oxygen Delivery Method: Circle System Utilized Preoxygenation: Pre-oxygenation with 100% oxygen Induction Type: IV induction Ventilation: Mask ventilation without difficulty Laryngoscope Size: Miller and 2 Grade View: Grade II Tube type: Oral Tube size: 7.0 mm Number of attempts: 1 Airway Equipment and Method: Stylet and Oral airway Placement Confirmation: ETT inserted through vocal cords under direct vision,  positive ETCO2 and breath sounds checked- equal and bilateral Secured at: 22 cm Tube secured with: Tape Dental Injury: Teeth and Oropharynx as per pre-operative assessment

## 2017-11-29 NOTE — Anesthesia Preprocedure Evaluation (Addendum)
Anesthesia Evaluation  Patient identified by MRN, date of birth, ID band Patient awake    Reviewed: Allergy & Precautions, NPO status , Patient's Chart, lab work & pertinent test results  Airway Mallampati: II  TM Distance: >3 FB Neck ROM: Full    Dental no notable dental hx. (+) Teeth Intact, Dental Advisory Given   Pulmonary COPD,  COPD inhaler, former smoker,    Pulmonary exam normal breath sounds clear to auscultation       Cardiovascular hypertension, Pt. on medications Normal cardiovascular exam Rhythm:Regular Rate:Normal  ECG: SR, rate 87   Neuro/Psych negative neurological ROS  negative psych ROS   GI/Hepatic negative GI ROS, Neg liver ROS,   Endo/Other  hyponatremia  Renal/GU negative Renal ROS     Musculoskeletal negative musculoskeletal ROS (+)   Abdominal   Peds  Hematology negative hematology ROS (+)   Anesthesia Other Findings RIGHT SUBMANDIBULAR GLAND OBSTRUCTION  Reproductive/Obstetrics                           Anesthesia Physical Anesthesia Plan  ASA: III  Anesthesia Plan: General   Post-op Pain Management:    Induction: Intravenous  PONV Risk Score and Plan: 3 and Midazolam, Dexamethasone, Ondansetron and Treatment may vary due to age or medical condition  Airway Management Planned: Oral ETT  Additional Equipment:   Intra-op Plan:   Post-operative Plan: Extubation in OR  Informed Consent: I have reviewed the patients History and Physical, chart, labs and discussed the procedure including the risks, benefits and alternatives for the proposed anesthesia with the patient or authorized representative who has indicated his/her understanding and acceptance.   Dental advisory given  Plan Discussed with: CRNA  Anesthesia Plan Comments:         Anesthesia Quick Evaluation

## 2017-11-29 NOTE — Progress Notes (Signed)
   Subjective:    Patient ID: Amber Chaney, female    DOB: 01/13/1951, 67 y.o.   MRN: [id number]  HPI She is feeling well.  No complaints.  Review of Systems     Objective:   Physical Exam AF VSS Alert, NAD Right neck incision clean and intact, no fluid collection, drain in place. Normal facial movement.     Assessment & Plan:  Right submandibular gland obstruction s/p resection.  The wound looks good.  Observe overnight with drain in place.

## 2017-11-29 NOTE — Progress Notes (Signed)
Pt new admit from PACU s/p right excision submandibular gland  Limited neck dissection, alert and oriented ambulatory, she urinated, with right neck dressing with gauze and JP drain with 20 ml output, husband at the bedside, ice pack applied.

## 2017-11-29 NOTE — Op Note (Signed)
11/29/2017  1:26 PM    [place], [place]  [id number]   Pre-Op Dx: Right submandibular gland swelling  Post-op Dx: Right submandibular gland chronic sialoadenitis  Proc: Right submandibular gland excision  Surg:  Jodi Marble T MD Ass't:  Blenda Nicely MD  Anes:  GOT  EBL: Minimal  Comp: None  Findings: Firm fibrotic mass consistent with the submandibular gland.  No significant adenopathy.  Frozen section reveals chronic sialoadenitis.  Procedure: With the patient in a comfortable supine position, general orotracheal anesthesia was induced without difficulty.  The neck was palpated with the findings as described above.  The proposed incision site was infiltrated with 1% Xylocaine with 1-100,000 epinephrine, 5 cc total.  A chlorhexidine sterile preparation and draping of the right neck was accomplished in the standard fashion.  A 6 cm incision in a relaxed skin tension line was executed and carried into the subcutaneous fat.  Cautery was used for hemostasis.  The platysma muscle was lysed.  Dissection was carried directly down onto the capsule of the gland.  There was relatively heavy fibrosis diffusely.  Soft tissues were dissected away from the gland from inferiorly upward in order to protect ramus mandibularis.  The mylohyoid muscle and digastric muscles were identified.  Dissection revealed the lingual nerve and hypoglossal nerve which were not disturbed.  Branches of facial artery and vein were controlled.  The duct was dissected up under the mylohyoid muscle and amputated.  The gland was sent for pathologic frozen section interpretation.  The wound was thoroughly irrigated.  A small amount of bipolar cautery was used for hemostasis.  A 10 French round drain was passed through the skin and laid into the depths of the wound and secured to the skin with a 3-0 nylon stitch.  The wound was closed with interrupted 4-0 Vicryl sutures at the platysmal layer and the subcuticular  layer.  Frozen section returned as benign.  No further surgery was indicated.  The skin was closed cosmetically with Dermabond adhesive.  The drain was placed to suction and observed to be functioning adequately.  Dispo:   PACU to 23-hour observation.  Plan: Ice, elevation, analgesia, drain removal and discharge home in 23 hours.  Tyson Alias MD

## 2017-11-29 NOTE — Transfer of Care (Signed)
Immediate Anesthesia Transfer of Care Note  Patient: Amber Chaney  Procedure(s) Performed: EXCISION RIGHT SUBMANDIBULAR GLAND, (Right Neck)  Patient Location: PACU  Anesthesia Type:General  Level of Consciousness: awake, oriented and patient cooperative  Airway & Oxygen Therapy: Patient Spontanous Breathing and Patient connected to nasal cannula oxygen  Post-op Assessment: Report given to RN and Post -op Vital signs reviewed and stable  Post vital signs: Reviewed  Last Vitals:  Vitals Value Taken Time  BP 145/70 11/29/2017  2:06 PM  Temp 36.9 C 11/29/2017  2:06 PM  Pulse 78 11/29/2017  2:06 PM  Resp 16 11/29/2017  2:06 PM  SpO2 95 % 11/29/2017  2:06 PM    Last Pain:  Vitals:   11/29/17 0913  TempSrc: Oral         Complications: No apparent anesthesia complications

## 2017-11-30 ENCOUNTER — Encounter (HOSPITAL_COMMUNITY): Payer: Self-pay | Admitting: Otolaryngology

## 2017-11-30 NOTE — Progress Notes (Signed)
Corinna Lines Sax to be D/C'd  per MD order. Discussed with the patient and all questions fully answered.  VSS, Skin clean, dry and intact without evidence of skin break down, no evidence of skin tears noted.  IV catheter discontinued intact. Site without signs and symptoms of complications. Dressing and pressure applied.  An After Visit Summary was printed and given to the patient. Patient received prescription.  D/c education completed with patient/family including follow up instructions, medication list, d/c activities limitations if indicated, with other d/c instructions as indicated by MD - patient able to verbalize understanding, all questions fully answered.   Patient instructed to return to ED, call 911, or call MD for any changes in condition.   Patient to be escorted via [place], and D/C home via private auto.

## 2017-11-30 NOTE — Discharge Summary (Signed)
Physician Discharge Summary  Patient ID: Amber Chaney MRN: [id number] DOB/AGE: 67-Mar-1952 67 y.o.  Admit date: 11/29/2017 Discharge date: 11/30/2017  Admission Diagnoses: Sialadenitis  Discharge Diagnoses:  Active Problems:   Sialadenitis   Discharged Condition: good  Hospital Course: 67 year old female presented to the hospital for right submandibular gland resection.  See operative note.  She was observed overnight with drain in place and did well.  On POD 1, she was felt stable for discharge and the drain was removed.  Consults: None  Significant Diagnostic Studies: None  Treatments: surgery: right submandibular gland resection  Discharge Exam: Blood pressure 129/70, pulse 70, temperature 98 F (36.7 C), temperature source Oral, resp. rate 16, height 4' 11.5" (1.511 m), weight 42.4 kg, SpO2 96 %. General appearance: alert, cooperative and no distress Neck: right submandibular incision clean and intact, no fluid collection, drain removed.  Normal facial movement.  Disposition: Discharge disposition: 01-Home or Self Care       Discharge Instructions    Diet - low sodium heart healthy   Complete by:  As directed    Discharge instructions   Complete by:  As directed    Avoid strenuous activity.  Advance to regular diet.  OK to allow incision to get wet, gently pat dry.  Do not apply ointment to incision.   Increase activity slowly   Complete by:  As directed      Allergies as of 11/30/2017      Reactions   A-g Pro Other (See Comments)   Shots cause tumors in arms      Medication List    TAKE these medications   albuterol (2.5 MG/3ML) 0.083% nebulizer solution Commonly known as:  PROVENTIL USE 1 VIAL VIA NEBULIZER EVERY 6 HOURS AS NEEDED What changed:    how much to take  how to take this  when to take this  reasons to take this  additional instructions   albuterol 108 (90 Base) MCG/ACT inhaler Commonly known as:  PROVENTIL HFA;VENTOLIN  HFA INHALE 1-2 PUFFS INTO THE LUNGS EVERY 6 HOURS AS NEEDED FOR WHEEZING OR SHORTNESS OF BREATH What changed:  See the new instructions.   amLODipine 5 MG tablet Commonly known as:  NORVASC Take 5 mg by mouth at bedtime.   CLARITIN 10 MG tablet Generic drug:  loratadine Take 10 mg by mouth daily as needed for allergies.   flunisolide 25 MCG/ACT (0.025%) Soln Commonly known as:  NASALIDE Place 1 spray into the nose 2 (two) times daily as needed (for allergies).   INCRUSE ELLIPTA 62.5 MCG/INH Aepb Generic drug:  umeclidinium bromide INHALE 1 PUFF INTO THE LUNGS DAILY What changed:  See the new instructions.   losartan 50 MG tablet Commonly known as:  COZAAR Take 1 tablet (50 mg total) by mouth daily.   SYMBICORT 160-4.5 MCG/ACT inhaler Generic drug:  budesonide-formoterol INHALE 2 PUFFS INTO THE LUNGS TWICE DAILY   Vitamin D (Ergocalciferol) 1.25 MG (50000 UT) Caps capsule Commonly known as:  DRISDOL Take 50,000 Units by mouth every Monday.      Follow-up Information    Jodi Marble, MD. Schedule an appointment as soon as possible for a visit in 2 weeks.   Specialty:  Otolaryngology Contact information: [address] [place]  [address] [phone]           Signed: Melida Quitter 11/30/2017, 8:46 AM

## 2017-12-01 NOTE — Anesthesia Postprocedure Evaluation (Signed)
Anesthesia Post Note  Patient: Amber Chaney  Procedure(s) Performed: EXCISION RIGHT SUBMANDIBULAR GLAND, (Right Neck)     Patient location during evaluation: PACU Anesthesia Type: General Level of consciousness: awake and alert Pain management: pain level controlled Vital Signs Assessment: post-procedure vital signs reviewed and stable Respiratory status: spontaneous breathing, nonlabored ventilation, respiratory function stable and patient connected to nasal cannula oxygen Cardiovascular status: blood pressure returned to baseline and stable Postop Assessment: no apparent nausea or vomiting Anesthetic complications: no    Last Vitals:  Vitals:   11/30/17 0123 11/30/17 0533  BP: 139/66 129/70  Pulse: 75 70  Resp: 16 16  Temp: 36.5 C 36.7 C  SpO2: 96% 96%    Last Pain:  Vitals:   11/30/17 0912  TempSrc:   PainSc: 0-No pain                 Ryan P Ellender

## 2018-02-18 ENCOUNTER — Ambulatory Visit (INDEPENDENT_AMBULATORY_CARE_PROVIDER_SITE_OTHER): Payer: Medicare Other | Admitting: Pulmonary Disease

## 2018-02-18 ENCOUNTER — Encounter: Payer: Self-pay | Admitting: Pulmonary Disease

## 2018-02-18 VITALS — BP 116/64 | HR 103 | Ht 59.5 in | Wt 94.8 lb

## 2018-02-18 DIAGNOSIS — J432 Centrilobular emphysema: Secondary | ICD-10-CM

## 2018-02-18 DIAGNOSIS — Z87891 Personal history of nicotine dependence: Secondary | ICD-10-CM

## 2018-02-18 NOTE — Progress Notes (Signed)
Synopsis: Former patient of Dr. Lanell Persons and Dr. Gwenette Greet who has COPD.  Quit smoking in 2015.  Subjective:   PATIENT ID: Amber Chaney GENDER: female DOB: Apr 23, 1950, MRN: [id number]   HPI  Chief Complaint  Patient presents with  . Follow-up    former SN pt being treated for emphysema.      This is a pleasant 68 year old female who comes to clinic today to establish care with me for the first time for her COPD.  She used to smoke cigarettes, quit smoking in 2014 after 44 years of smoking 1/2 to 1 pack of cigarettes daily.  She was diagnosed with influenza and pneumonia in 2015.  Around that time she quit smoking.  She says that on a day-to-day basis she does not feel limited by her shortness of breath.  She says that she only uses albuterol on an as-needed basis.  She is compliant with Symbicort and Incruse.  She says that she used to have a severe allergy to the influenza vaccine and had several large hard nodules develop in her arms which ultimately required surgical resection.  She paused on annual flu vaccines until about 3 to 4 years ago when she started getting them again.  Most recently she said that when she received the vaccine she had fevers chills Reiger's and dizziness.  She is asking about whether or not there is an alternative formulation she could receive.  Past Medical History:  Diagnosis Date  . Allergic rhinitis   . Cancer ([place])    skin cancer. cervical - pre cancerous  . COPD (chronic obstructive pulmonary disease) ([place])   . Hypertension   . Pneumonia     2- 3 times last time 01/2013     Family History  Problem Relation Age of Onset  . Allergies Mother   . Asthma Mother      Social History   Socioeconomic History  . Marital status: Married    Spouse name: Not on file  . Number of children: Y  . Years of education: Not on file  . Highest education level: Not on file  Occupational History  . 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 Financial resource strain: Not on file  . Food insecurity:    Worry: Not on file    Inability: Not on file  . Transportation needs:    Medical: Not on file    Non-medical: Not on file  Tobacco Use  . Smoking status: Former Smoker    Packs/day: 0.25    Years: 44.00    Pack years: 11.00    Types: Cigarettes    Last attempt to quit: 11/15/2012    Years since quitting: 5.2  . Smokeless tobacco: Never Used  . Tobacco comment: Started smoking at age 89.    Substance and Sexual Activity  . Alcohol use: Yes    Alcohol/week: 14.0 standard drinks    Types: 14 Cans of beer per week  . Drug use: No  . Sexual activity: Yes  Lifestyle  . Physical activity:    Days per week: Not on file    Minutes per session: Not on file  . Stress: Not on file  Relationships  . Social connections:    Talks on phone: Not on file    Gets together: Not on file    Attends religious service: Not on file    Active member of club or organization: Not on  file    Attends meetings of clubs or organizations: Not on file    Relationship status: Not on file  . Intimate partner violence:    Fear of current or ex partner: Not on file    Emotionally abused: Not on file    Physically abused: Not on file    Forced sexual activity: Not on file  Other Topics Concern  . Not on file  Social History Narrative  . Not on file     Allergies  Allergen Reactions  . A-G Pro Other (See Comments)    Shots cause tumors in arms     Outpatient Medications Prior to Visit  Medication Sig Dispense Refill  . albuterol (PROVENTIL HFA;VENTOLIN HFA) 108 (90 Base) MCG/ACT inhaler INHALE 1-2 PUFFS INTO THE LUNGS EVERY 6 HOURS AS NEEDED FOR WHEEZING OR SHORTNESS OF BREATH (Patient taking differently: Inhale 1-2 puffs into the lungs every 6 (six) hours as needed for wheezing or shortness of breath. ) 8.5 g 0  . albuterol (PROVENTIL) (2.5 MG/3ML) 0.083% nebulizer solution USE 1 VIAL VIA NEBULIZER EVERY 6 HOURS AS  NEEDED (Patient taking differently: Take 2.5 mg by nebulization every 6 (six) hours as needed for wheezing or shortness of breath. ) 75 mL 3  . amLODipine (NORVASC) 5 MG tablet Take 5 mg by mouth at bedtime.     . flunisolide (NASALIDE) 25 MCG/ACT (0.025%) SOLN Place 1 spray into the nose 2 (two) times daily as needed (for allergies). 25 mL 6  . INCRUSE ELLIPTA 62.5 MCG/INH AEPB INHALE 1 PUFF INTO THE LUNGS DAILY (Patient taking differently: Inhale 1 puff into the lungs daily. ) 1 each 6  . loratadine (CLARITIN) 10 MG tablet Take 10 mg by mouth daily as needed for allergies.     Marland Kitchen losartan (COZAAR) 50 MG tablet Take 1 tablet (50 mg total) by mouth daily. 90 tablet 3  . SYMBICORT 160-4.5 MCG/ACT inhaler INHALE 2 PUFFS INTO THE LUNGS TWICE DAILY (Patient taking differently: Inhale 2 puffs into the lungs 2 (two) times daily. ) 10.2 g 3  . Vitamin D, Ergocalciferol, (DRISDOL) 50000 UNITS CAPS Take 50,000 Units by mouth every Monday.      No facility-administered medications prior to visit.     Review of Systems  Constitutional: Negative for fever, malaise/fatigue and weight loss.  HENT: Negative for congestion, nosebleeds and sinus pain.   Respiratory: Negative for cough, sputum production and wheezing.   Cardiovascular: Negative for chest pain, orthopnea and claudication.  Neurological: Negative for weakness.      Objective:  Physical Exam   Vitals:   02/18/18 0854  BP: 116/64  Pulse: (!) 103  SpO2: 95%  Weight: 94 lb 12.8 oz (43 kg)  Height: 4' 11.5" (1.511 m)    Gen: well appearing HENT: OP clear, TM's clear, neck supple PULM: CTA B, normal percussion CV: RRR, no mgr, trace edema GI: BS+, soft, nontender Derm: no cyanosis or rash Psyche: normal mood and affect   CBC    Component Value Date/Time   WBC 9.9 11/25/2017 1347   RBC 4.15 11/25/2017 1347   HGB 13.9 11/25/2017 1347   HCT 42.4 11/25/2017 1347   PLT 295 11/25/2017 1347   MCV 102.2 (H) 11/25/2017 1347   MCH  33.5 11/25/2017 1347   MCHC 32.8 11/25/2017 1347   RDW 11.9 11/25/2017 1347   LYMPHSABS 0.5 (L) 02/04/2013 0743   MONOABS 0.9 02/04/2013 0743   EOSABS 0.0 02/04/2013 0743   BASOSABS 0.0 02/04/2013 0743  Chest imaging: 06/2016 CXR personally reviewed showing emphysema  PFT: 2017 PFT: Ratio 49%, FEV1 1.0L (47% pred), DLCO 14.58mL (75% pred)  Labs:  Path:  Echo:  Heart Catheterization:  Records from November 2019 hospitalization for right submandibular gland resection reviewed, this was performed by Dr. Erik Obey     Assessment & Plan:   Centrilobular emphysema [facility]) - Plan: Ambulatory Referral for Lung Cancer Scre  Ex-cigarette smoker - Plan: Ambulatory Referral for Lung Cancer Scre  Discussion: This is a pleasant 68 year old female who is followed with 2 of my partners over the last several years for severe COPD.  Despite her severe airflow obstruction she is not limited from a functional standpoint and she has not had an exacerbation of her COPD since she quit smoking in 2015.  She is at increased risk for lung cancer, we discussed this at length today.  She is willing to proceed with lung cancer screening.  In regards to her annual flu vaccines, it sounds as if she did have a very severe reaction to the standard vaccination formulation several years ago and most recently she had exaggerated type responses with severe fever, Reiger's and systemic complaints.  I think it may be worthwhile for her to try a non-egg-based produced vaccine this year.  Plan: Severe COPD: Continue Symbicort 2 puffs twice a day Continue Incruse Practice good hand hygiene Stay active Use albuterol as needed for chest tightness wheezing or shortness of breath In the fall we will consider using a cell culture produced flu vaccine given the reaction you have had in the past  Prior tobacco abuse: We will refer you to the lung cancer screening program  We will plan on seeing you back in September  2020 or sooner if needed  > 50% of this 28 minutes spent face to face   Current Outpatient Medications:  .  albuterol (PROVENTIL HFA;VENTOLIN HFA) 108 (90 Base) MCG/ACT inhaler, INHALE 1-2 PUFFS INTO THE LUNGS EVERY 6 HOURS AS NEEDED FOR WHEEZING OR SHORTNESS OF BREATH (Patient taking differently: Inhale 1-2 puffs into the lungs every 6 (six) hours as needed for wheezing or shortness of breath. ), Disp: 8.5 g, Rfl: 0 .  albuterol (PROVENTIL) (2.5 MG/3ML) 0.083% nebulizer solution, USE 1 VIAL VIA NEBULIZER EVERY 6 HOURS AS NEEDED (Patient taking differently: Take 2.5 mg by nebulization every 6 (six) hours as needed for wheezing or shortness of breath. ), Disp: 75 mL, Rfl: 3 .  amLODipine (NORVASC) 5 MG tablet, Take 5 mg by mouth at bedtime. , Disp: , Rfl:  .  flunisolide (NASALIDE) 25 MCG/ACT (0.025%) SOLN, Place 1 spray into the nose 2 (two) times daily as needed (for allergies)., Disp: 25 mL, Rfl: 6 .  INCRUSE ELLIPTA 62.5 MCG/INH AEPB, INHALE 1 PUFF INTO THE LUNGS DAILY (Patient taking differently: Inhale 1 puff into the lungs daily. ), Disp: 1 each, Rfl: 6 .  loratadine (CLARITIN) 10 MG tablet, Take 10 mg by mouth daily as needed for allergies. , Disp: , Rfl:  .  losartan (COZAAR) 50 MG tablet, Take 1 tablet (50 mg total) by mouth daily., Disp: 90 tablet, Rfl: 3 .  SYMBICORT 160-4.5 MCG/ACT inhaler, INHALE 2 PUFFS INTO THE LUNGS TWICE DAILY (Patient taking differently: Inhale 2 puffs into the lungs 2 (two) times daily. ), Disp: 10.2 g, Rfl: 3 .  Vitamin D, Ergocalciferol, (DRISDOL) 50000 UNITS CAPS, Take 50,000 Units by mouth every Monday. , Disp: , Rfl:

## 2018-02-18 NOTE — Patient Instructions (Signed)
Severe COPD: Continue Symbicort 2 puffs twice a day Continue Incruse Practice good hand hygiene Stay active Use albuterol as needed for chest tightness wheezing or shortness of breath In the fall we will consider using a cell culture produced flu vaccine given the reaction you have had in the past  Prior tobacco abuse: We will refer you to the lung cancer screening program  We will plan on seeing you back in September 2020 or sooner if needed

## 2018-03-05 ENCOUNTER — Other Ambulatory Visit: Payer: Self-pay | Admitting: Pulmonary Disease

## 2018-03-10 ENCOUNTER — Telehealth: Payer: Self-pay | Admitting: Pulmonary Disease

## 2018-03-10 DIAGNOSIS — Z122 Encounter for screening for malignant neoplasm of respiratory organs: Secondary | ICD-10-CM

## 2018-03-10 DIAGNOSIS — Z87891 Personal history of nicotine dependence: Secondary | ICD-10-CM

## 2018-03-12 NOTE — Telephone Encounter (Signed)
Spoke with pt and scheduled SDMV 05/07/18 10:30 CT ordered Nothing further needed

## 2018-03-14 ENCOUNTER — Other Ambulatory Visit: Payer: Self-pay | Admitting: Pulmonary Disease

## 2018-03-18 NOTE — Telephone Encounter (Signed)
Patient needs refill for [employer] and [employer].  [facility] is [facility].  Patient phone number is [phone].

## 2018-04-10 ENCOUNTER — Other Ambulatory Visit: Payer: Self-pay | Admitting: Pulmonary Disease

## 2018-05-07 ENCOUNTER — Encounter: Payer: Medicare Other | Admitting: Acute Care

## 2018-05-07 ENCOUNTER — Ambulatory Visit: Payer: Medicare Other

## 2018-07-07 ENCOUNTER — Other Ambulatory Visit: Payer: Self-pay | Admitting: Pulmonary Disease

## 2018-07-15 ENCOUNTER — Telehealth: Payer: Self-pay | Admitting: *Deleted

## 2018-07-15 NOTE — Telephone Encounter (Signed)

## 2018-07-16 ENCOUNTER — Other Ambulatory Visit: Payer: Self-pay

## 2018-07-16 ENCOUNTER — Ambulatory Visit (INDEPENDENT_AMBULATORY_CARE_PROVIDER_SITE_OTHER): Payer: Medicare Other | Admitting: Acute Care

## 2018-07-16 ENCOUNTER — Encounter: Payer: Self-pay | Admitting: Acute Care

## 2018-07-16 ENCOUNTER — Ambulatory Visit (INDEPENDENT_AMBULATORY_CARE_PROVIDER_SITE_OTHER)
Admission: RE | Admit: 2018-07-16 | Discharge: 2018-07-16 | Disposition: A | Discharge status: Home / Self Care | Payer: Medicare Other | Source: Ambulatory Visit | Attending: Acute Care | Admitting: Acute Care

## 2018-07-16 VITALS — BP 126/68 | HR 93 | Ht 59.0 in | Wt 97.4 lb

## 2018-07-16 DIAGNOSIS — Z122 Encounter for screening for malignant neoplasm of respiratory organs: Secondary | ICD-10-CM | POA: Diagnosis not present

## 2018-07-16 DIAGNOSIS — Z87891 Personal history of nicotine dependence: Secondary | ICD-10-CM | POA: Diagnosis not present

## 2018-07-16 NOTE — Progress Notes (Addendum)
Shared Decision Making Visit Lung Cancer Screening Program [phone])   Eligibility:  Age 68 y.o.  Pack Years Smoking History Calculation 32 pack year smoking history (# packs/per year x # years smoked)  Recent History of coughing up blood  no  Unexplained weight loss? no ( >Than 15 pounds within the last 6 months )  Prior History Lung / other cancer no (Diagnosis within the last 5 years already requiring surveillance chest CT Scans).  Smoking Status Former Smoker  Former Smokers: Years since quit: 6 years  Quit Date: 11/15/2012  Visit Components:  Discussion included one or more decision making aids. yes  Discussion included risk/benefits of screening. yes  Discussion included potential follow up diagnostic testing for abnormal scans. yes  Discussion included meaning and risk of over diagnosis. yes  Discussion included meaning and risk of False Positives. yes  Discussion included meaning of total radiation exposure. yes  Counseling Included:  Importance of adherence to annual lung cancer LDCT screening. yes  Impact of comorbidities on ability to participate in the program. yes  Ability and willingness to under diagnostic treatment. yes  Smoking Cessation Counseling:  Current Smokers:   Discussed importance of smoking cessation. NA Former smoker  Information about tobacco cessation classes and interventions provided to patient. yes  Patient provided with "ticket" for LDCT Scan. yes  Symptomatic Patient. no  Counseling  Diagnosis Code: Tobacco Use Z72.0  Asymptomatic Patient yes  Counseling (Intermediate counseling: > three minutes counseling) Z3299  Former Smokers:   Discussed the importance of maintaining cigarette abstinence. yes  Diagnosis Code: Personal History of Nicotine Dependence. M42.683  Information about tobacco cessation classes and interventions provided to patient. Yes  Patient provided with "ticket" for LDCT Scan. yes  Written  Order for Lung Cancer Screening with LDCT placed in Epic. Yes (CT Chest Lung Cancer Screening Low Dose W/O CM) [id number] Z12.2-Screening of respiratory organs Z87.891-Personal history of nicotine dependence  BP 126/68 (BP Location: Right Arm, Cuff Size: Normal)   Pulse 93   Ht 4\' 11"  (1.499 m)   Wt 97 lb 6.4 oz (44.2 kg)   SpO2 100%   BMI 19.67 kg/m    I spent 25 minutes of face to face time with Amber Chaney discussing the risks and benefits of lung cancer screening. We viewed a power point together that explained in detail the above noted topics. We took the time to pause the power point at intervals to allow for questions to be asked and answered to ensure understanding. We discussed that she had taken the single most powerful action possible to decrease her risk of developing lung cancer when she quit smoking. I counseled her to remain smoke free, and to contact me if she ever had the desire to smoke again so that I can provide resources and tools to help support the effort to remain smoke free. We discussed the time and location of the scan, and that either  Amber Glassman RN or I will call with the results within  24-48 hours of receiving them. She has my card and contact information in the event she needs to speak with me, in addition to a copy of the power point we reviewed as a resource. She verbalized understanding of all of the above and had no further questions upon leaving the office.     I explained to the patient that there has been a high incidence of coronary artery disease noted on these exams. I explained that this is a non-gated exam  therefore degree or severity cannot be determined. This patient is  not on statin therapy. I have asked the patient to follow-up with their PCP regarding any incidental finding of coronary artery disease and management with diet or medication as they feel is clinically indicated. The patient verbalized understanding of the above and had no further  questions.  Magdalen Spatz, NP 07/16/2018 9:46 AM

## 2018-07-25 ENCOUNTER — Other Ambulatory Visit: Payer: Self-pay | Admitting: *Deleted

## 2018-07-25 DIAGNOSIS — Z122 Encounter for screening for malignant neoplasm of respiratory organs: Secondary | ICD-10-CM

## 2018-07-25 DIAGNOSIS — Z87891 Personal history of nicotine dependence: Secondary | ICD-10-CM

## 2018-08-18 ENCOUNTER — Other Ambulatory Visit: Payer: Self-pay | Admitting: Pulmonary Disease

## 2018-10-11 ENCOUNTER — Other Ambulatory Visit: Payer: Self-pay | Admitting: Pulmonary Disease

## 2018-10-29 ENCOUNTER — Other Ambulatory Visit: Payer: Self-pay | Admitting: Pulmonary Disease

## 2018-11-05 ENCOUNTER — Other Ambulatory Visit: Payer: Self-pay

## 2018-11-05 ENCOUNTER — Ambulatory Visit (INDEPENDENT_AMBULATORY_CARE_PROVIDER_SITE_OTHER): Payer: Medicare Other | Admitting: Critical Care Medicine

## 2018-11-05 ENCOUNTER — Encounter: Payer: Self-pay | Admitting: Critical Care Medicine

## 2018-11-05 VITALS — BP 128/62 | HR 83 | Temp 97.1°F | Ht 59.5 in | Wt 97.0 lb

## 2018-11-05 DIAGNOSIS — J432 Centrilobular emphysema: Secondary | ICD-10-CM | POA: Diagnosis not present

## 2018-11-05 DIAGNOSIS — Z87891 Personal history of nicotine dependence: Secondary | ICD-10-CM | POA: Diagnosis not present

## 2018-11-05 NOTE — Progress Notes (Signed)
Synopsis: Referred for COPD by Bartholome Bill, MD.  Previously a patient of Dr. Lenna Gilford.  Subjective:   PATIENT ID: Amber Chaney GENDER: female DOB: 1950-10-14, MRN: [id number]  Chief Complaint  Patient presents with  . Follow-up    morning congestion     Amber Chaney is a 68 year old woman with a history of COPD who presents for routine follow-up.  She has been maintained on Symbicort and Incruse and done well.  She previously was on Spiriva, but had intolerable side effects.  She seldom needs to use albuterol for symptoms, only a few times per week.  Baseline she has a morning cough with some sputum production that does not persist throughout the day.  She has mild dyspnea on exertion.  She is very happy with her symptom control and does not feel that her activity is in any way limited.  Her last exacerbation was associated with influenza and pneumonia in 2015, which required hospitalization.  She quit smoking in 2014 after 44 years of smoking 0.5 to 1 pack/day.   She has well-controlled seasonal allergies.  Mostly her symptoms are when there is a lot of pollen.  She takes Claritin and intranasal steroids as needed.  She had a low-dose flu vaccine already this year, had mild symptoms of fever and chills.  Last year she had a severe reaction to the high-dose flu vaccine-rigors, fever, dizziness.  When she was much younger she had severe reactions to flu vaccines causing nodules in her arms that required surgical removal.  She has followed with Eric Form, NP for lung cancer screening and plans to continue.     Past Medical History:  Diagnosis Date  . Allergic rhinitis   . Cancer ([place])    skin cancer. cervical - pre cancerous  . COPD (chronic obstructive pulmonary disease) ([place])   . Hypertension   . Pneumonia     2- 3 times last time 01/2013     Family History  Problem Relation Age of Onset  . Allergies Mother   . Asthma Mother      Past Surgical History:  Procedure  Laterality Date  . APPENDECTOMY    . BREAST SURGERY Bilateral    Augmentation  . Cervix leap    . SKIN GRAFT Left    with tear  duct repair  . SUBMANDIBULAR GLAND EXCISION Right 11/29/2017   Procedure: EXCISION RIGHT SUBMANDIBULAR GLAND,;  Surgeon: Jodi Marble, MD;  Location: [facility];  Service: ENT;  Laterality: Right;  . TONSILLECTOMY    . tumor removed     from arms d/t cold and flu shots    Social History   Socioeconomic History  . Marital status: Married    Spouse name: Not on file  . Number of children: Y  . Years of education: Not on file  . Highest education level: Not on file  Occupational History  . 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 Financial resource strain: Not on file  . Food insecurity    Worry: Not on file    Inability: Not on file  . Transportation needs    Medical: Not on file    Non-medical: Not on file  Tobacco Use  . Smoking status: Former Smoker    Packs/day: 0.25    Years: 44.00    Pack years: 11.00    Types: Cigarettes    Quit date: 11/15/2012    Years since quitting: 5.9  .  Smokeless tobacco: Never Used  . Tobacco comment: Started smoking at age 46.    Substance and Sexual Activity  . Alcohol use: Yes    Alcohol/week: 14.0 standard drinks    Types: 14 Cans of beer per week  . Drug use: No  . Sexual activity: Yes  Lifestyle  . Physical activity    Days per week: Not on file    Minutes per session: Not on file  . Stress: Not on file  Relationships  . Social Herbalist on phone: Not on file    Gets together: Not on file    Attends religious service: Not on file    Active member of club or organization: Not on file    Attends meetings of clubs or organizations: Not on file    Relationship status: Not on file  . Intimate partner violence    Fear of current or ex partner: Not on file    Emotionally abused: Not on file    Physically abused: Not on file    Forced sexual activity: Not on  file  Other Topics Concern  . Not on file  Social History Narrative  . Not on file     Allergies  Allergen Reactions  . A-G Pro Other (See Comments)    Shots cause tumors in arms     Immunization History  Administered Date(s) Administered  . Influenza Whole 10/21/2018  . Influenza, High Dose Seasonal PF 12/18/2017  . Influenza,inj,Quad PF,6+ Mos 02/28/2016  . Influenza,inj,Quad PF,6-35 Mos 11/15/2016  . Pneumococcal Conjugate-13 08/02/2015, 11/15/2016  . Pneumococcal Polysaccharide-23 03/14/2010  . Tdap 04/20/2014  . Zoster 08/16/2015    Outpatient Medications Prior to Visit  Medication Sig Dispense Refill  . albuterol (PROVENTIL HFA;VENTOLIN HFA) 108 (90 Base) MCG/ACT inhaler INHALE 1 TO 2 PUFFS INTO THE LUNGS EVERY 6 HOURS AS NEEDED FOR WHEEZING OR SHORTNESS OF BREATH 8.5 g 5  . albuterol (PROVENTIL) (2.5 MG/3ML) 0.083% nebulizer solution USE 1 VIAL VIA NEBULIZER EVERY 6 HOURS AS NEEDED (Patient taking differently: Take 2.5 mg by nebulization every 6 (six) hours as needed for wheezing or shortness of breath. ) 75 mL 3  . amLODipine (NORVASC) 5 MG tablet Take 5 mg by mouth at bedtime.     . flunisolide (NASALIDE) 25 MCG/ACT (0.025%) SOLN Place 1 spray into the nose 2 (two) times daily as needed (for allergies). 25 mL 6  . INCRUSE ELLIPTA 62.5 MCG/INH AEPB INHALE 1 PUFF INTO THE LUNGS DAILY 30 each 1  . loratadine (CLARITIN) 10 MG tablet Take 10 mg by mouth daily as needed for allergies.     Marland Kitchen losartan (COZAAR) 50 MG tablet Take 1 tablet (50 mg total) by mouth daily. 90 tablet 3  . SYMBICORT 160-4.5 MCG/ACT inhaler INHALE 2 PUFFS INTO THE LUNGS TWICE DAILY 10.2 g 3  . Vitamin D, Ergocalciferol, (DRISDOL) 50000 UNITS CAPS Take 50,000 Units by mouth every Monday.      No facility-administered medications prior to visit.     Review of Systems  Constitutional: Negative for chills, diaphoresis, fever, malaise/fatigue and weight loss.  HENT: Positive for congestion. Negative for  ear pain and sore throat.   Respiratory: Positive for cough and sputum production. Negative for hemoptysis, shortness of breath and wheezing.   Cardiovascular: Negative for chest pain, palpitations and leg swelling.  Gastrointestinal: Negative for abdominal pain, heartburn, nausea and vomiting.  Genitourinary: Negative for frequency.  Musculoskeletal: Negative for joint pain and myalgias.  Skin: Negative  for itching and rash.  Neurological: Negative for dizziness, weakness and headaches.  Endo/Heme/Allergies: Bruises/bleeds easily.  Psychiatric/Behavioral: Negative for depression. The patient is not nervous/anxious.      Objective:   Vitals:   11/05/18 0922  BP: 128/62  Pulse: 83  Temp: (!) 97.1 F (36.2 C)  TempSrc: Temporal  SpO2: 99%  Weight: 97 lb (44 kg)  Height: 4' 11.5" (1.511 m)   99% on   RA BMI Readings from Last 3 Encounters:  11/05/18 19.26 kg/m  07/16/18 19.67 kg/m  02/18/18 18.83 kg/m   Wt Readings from Last 3 Encounters:  11/05/18 97 lb (44 kg)  07/16/18 97 lb 6.4 oz (44.2 kg)  02/18/18 94 lb 12.8 oz (43 kg)    Physical Exam Vitals signs reviewed.  Constitutional:      Appearance: Normal appearance.  HENT:     Head: Normocephalic and atraumatic.     Nose:     Comments: Deferred due to masking requirement.    Mouth/Throat:     Comments: Deferred due to masking requirement. Eyes:     General: No scleral icterus. Neck:     Musculoskeletal: Neck supple.  Cardiovascular:     Rate and Rhythm: Normal rate and regular rhythm.     Heart sounds: No murmur.  Pulmonary:     Comments: Breathing comfortably on room air, no conversational dyspnea or tachypnea.  Clear to auscultation bilaterally. Abdominal:     General: There is no distension.     Palpations: Abdomen is soft.     Tenderness: There is no abdominal tenderness.  Musculoskeletal:        General: No swelling or deformity.  Lymphadenopathy:     Cervical: No cervical adenopathy.  Skin:     General: Skin is warm and dry.     Findings: No rash.  Neurological:     General: No focal deficit present.     Mental Status: She is alert.     Coordination: Coordination normal.  Psychiatric:        Mood and Affect: Mood normal.        Behavior: Behavior normal.      CBC    Component Value Date/Time   WBC 9.9 11/25/2017 1347   RBC 4.15 11/25/2017 1347   HGB 13.9 11/25/2017 1347   HCT 42.4 11/25/2017 1347   PLT 295 11/25/2017 1347   MCV 102.2 (H) 11/25/2017 1347   MCH 33.5 11/25/2017 1347   MCHC 32.8 11/25/2017 1347   RDW 11.9 11/25/2017 1347   LYMPHSABS 0.5 (L) 02/04/2013 0743   MONOABS 0.9 02/04/2013 0743   EOSABS 0.0 02/04/2013 0743   BASOSABS 0.0 02/04/2013 0743     Chest Imaging- films reviewed: CT chest 07/16/2018- panlobular emphysema, mild airway thickening.  Linear scars bilateral lower lobes.  Right upper lobe subpleural nodule.  Vascular calcifications.  No significant mediastinal or hilar adenopathy.  Surgical breast implants.  Pulmonary Functions Testing Results: PFT Results Latest Ref Rng & Units 12/26/2015  FVC-Pre L 2.15  FVC-Predicted Pre % 78  FVC-Post L 2.33  FVC-Predicted Post % 85  Pre FEV1/FVC % % 46  Post FEV1/FCV % % 49  FEV1-Pre L 1.00  FEV1-Predicted Pre % 47  FEV1-Post L 1.15  DLCO UNC% % 75  DLCO COR %Predicted % 75  Severe obstruction with significant bronchodilator reversibility.  Mild diffusion limitation.  Normal lung volumes.  Flow volume loop consistent with obstruction.    Assessment & Plan:     ICD-10-CM  1. Former smoker  Z87.891   2. Centrilobular emphysema (HCC)  J43.2    COPD with centrilobular emphysema- GOLD A/C- significant obstruction, very infrequent exacerbations. -UTD on pneumococcal and seasonal flu vaccinations -Continue Incruse and Symbicort as prescribed. -Continue albuterol as needed. -Continue mask wearing, social distancing, handwashing per COVID-19 precautions.  She understands that she is increased  risk for complications associated with respiratory viral illnesses. -Please call us if your symptoms are worsening.  History of tobacco abuse -Congratulated her on her ongoing success with quitting -Follow up with Eric Form, NP for ongoing lung cancer screening   RTC in 1 year.   Current Outpatient Medications:  .  albuterol (PROVENTIL HFA;VENTOLIN HFA) 108 (90 Base) MCG/ACT inhaler, INHALE 1 TO 2 PUFFS INTO THE LUNGS EVERY 6 HOURS AS NEEDED FOR WHEEZING OR SHORTNESS OF BREATH, Disp: 8.5 g, Rfl: 5 .  albuterol (PROVENTIL) (2.5 MG/3ML) 0.083% nebulizer solution, USE 1 VIAL VIA NEBULIZER EVERY 6 HOURS AS NEEDED (Patient taking differently: Take 2.5 mg by nebulization every 6 (six) hours as needed for wheezing or shortness of breath. ), Disp: 75 mL, Rfl: 3 .  amLODipine (NORVASC) 5 MG tablet, Take 5 mg by mouth at bedtime. , Disp: , Rfl:  .  flunisolide (NASALIDE) 25 MCG/ACT (0.025%) SOLN, Place 1 spray into the nose 2 (two) times daily as needed (for allergies)., Disp: 25 mL, Rfl: 6 .  INCRUSE ELLIPTA 62.5 MCG/INH AEPB, INHALE 1 PUFF INTO THE LUNGS DAILY, Disp: 30 each, Rfl: 1 .  loratadine (CLARITIN) 10 MG tablet, Take 10 mg by mouth daily as needed for allergies. , Disp: , Rfl:  .  losartan (COZAAR) 50 MG tablet, Take 1 tablet (50 mg total) by mouth daily., Disp: 90 tablet, Rfl: 3 .  SYMBICORT 160-4.5 MCG/ACT inhaler, INHALE 2 PUFFS INTO THE LUNGS TWICE DAILY, Disp: 10.2 g, Rfl: 3 .  Vitamin D, Ergocalciferol, (DRISDOL) 50000 UNITS CAPS, Take 50,000 Units by mouth every Monday. , Disp: , Rfl:    Julian Hy, DO [facility] 11/05/2018 9:46 AM

## 2018-11-05 NOTE — Patient Instructions (Addendum)
Thank you for visiting Dr. Carlis Abbott at [facility]. We recommend the following: Keep inhalers the same. Call us with any issues. They will do your lung screening CT after 1 year.   Return in about 1 year (around 11/05/2019).    Please do your part to reduce the spread of COVID-19.

## 2018-12-09 ENCOUNTER — Other Ambulatory Visit: Payer: Self-pay | Admitting: Pulmonary Disease

## 2019-02-18 ENCOUNTER — Telehealth: Payer: Self-pay | Admitting: Critical Care Medicine

## 2019-02-18 DIAGNOSIS — J432 Centrilobular emphysema: Secondary | ICD-10-CM

## 2019-02-18 NOTE — Telephone Encounter (Signed)
Dr. Carlis Abbott  This patient was last seen by you on 11/05/18 for centrilobular emphysema. She is also a former smoker.   The patient was issued Incruse 12/09/18. She also has Symbicort and a rescue inhaler. Based on her response below the insurance does not cover. Would Spiriva be an appropriate alternative?

## 2019-02-18 NOTE — Telephone Encounter (Signed)
Yes that would be great, thanks!  LPC

## 2019-02-19 MED ORDER — SPIRIVA HANDIHALER 18 MCG IN CAPS: 18.0000 ug | ORAL_CAPSULE | Freq: Every day | RESPIRATORY_TRACT | 12 refills | Status: DC

## 2019-02-19 NOTE — Telephone Encounter (Signed)
Confirmed with Dr. Carlis Abbott the patient to take Spiriva and Symbicort. Incruse discontinued. Tried to leave a message for the patient to let her know. Her phone service was not allowing the message to be recorded.  Notation made on the prescription to the pharmacy Spiriva was issued as alternative.  Nothing further needed at this time.

## 2019-02-20 ENCOUNTER — Other Ambulatory Visit: Payer: Self-pay | Admitting: Pulmonary Disease

## 2019-02-27 ENCOUNTER — Telehealth: Payer: Self-pay

## 2019-02-27 NOTE — Telephone Encounter (Signed)
PA request was received from (pharmacy): [facility] Phone:[phone] Fax: [phone] Medication name and strength: Symicort 160 Ordering Provider: Dr.Clark  Was PA started with CMM?: yes If yes, please enter KEY: [id number] Medication tried and failed: Dulera 100, Incurse, Spiriva  Covered Alternatives:   PA has been approved from 02/25/19-01/15/20 Pharmacy made aware.Nothibng further needed.

## 2019-06-09 ENCOUNTER — Telehealth: Payer: Self-pay | Admitting: Critical Care Medicine

## 2019-06-09 MED ORDER — FLUNISOLIDE 25 MCG/ACT (0.025%) NA SOLN: 1.0000 | Freq: Two times a day (BID) | NASAL | 6 refills | Status: DC | PRN

## 2019-06-09 MED ORDER — ALBUTEROL SULFATE HFA 108 (90 BASE) MCG/ACT IN AERS: 1.0000 | INHALATION_SPRAY | Freq: Four times a day (QID) | RESPIRATORY_TRACT | 5 refills | Status: DC | PRN

## 2019-06-09 NOTE — Telephone Encounter (Signed)
Refills for both albuterol inhaler and flunisolide have been sent to preferred pharmacy for pt. Called and spoke with pt letting her know this had been done and she verbalized understanding. Nothing further needed.

## 2019-10-28 ENCOUNTER — Ambulatory Visit: Payer: Medicare Other | Admitting: Critical Care Medicine

## 2019-11-05 ENCOUNTER — Encounter: Payer: Self-pay | Admitting: Critical Care Medicine

## 2019-11-05 ENCOUNTER — Ambulatory Visit (INDEPENDENT_AMBULATORY_CARE_PROVIDER_SITE_OTHER): Payer: Medicare Other | Admitting: Critical Care Medicine

## 2019-11-05 ENCOUNTER — Other Ambulatory Visit: Payer: Self-pay

## 2019-11-05 VITALS — BP 132/62 | HR 95 | Temp 97.1°F | Ht 59.5 in | Wt 99.0 lb

## 2019-11-05 DIAGNOSIS — J432 Centrilobular emphysema: Secondary | ICD-10-CM

## 2019-11-05 DIAGNOSIS — Z87891 Personal history of nicotine dependence: Secondary | ICD-10-CM

## 2019-11-05 NOTE — Progress Notes (Signed)
Synopsis: Referred for COPD by Bartholome Bill, MD.  Previously a patient of Dr. Lenna Gilford.  Subjective:   PATIENT ID: Amber Chaney GENDER: female DOB: [birth year], MRN: [id number]  Chief Complaint  Patient presents with  . Follow-up    Patient doing good overall, no concerns  Presents for follow up.  Amber Chaney a 69 year old woman with a history of COPD who presents for follow-up.  She continues on daily Spiriva and twice daily Symbicort.  She feels like her medication regimen is optimized.  She very infrequently has exacerbations.  She brought albuterol nebulizers that she has not used, both which expired 2 to 3 years ago.  Her only side effect from her medications is occasionally having dry mouth from Spiriva.  She denies wheezing, sputum production change.  She has occasional cough in the morning due to nasal congestion that she has overnight, which always clears by midday.  She has dyspnea on exertion when walking up hills, but can walk more than a mile on flat ground.  She walks more than a mile about 3 to 4 days/week for exercise.  She is up-to-date on flu shot (gets low-dose after previously having a bad reaction to high-dose), Covid, pneumonia shots.  She has not previously tried taking any medication for her nasal congestion.    OV 11/05/18: Amber Chaney is a 69 year old woman with a history of COPD who presents for routine follow-up.  She has been maintained on Symbicort and Incruse and done well.  She previously was on Spiriva, but had intolerable side effects.  She seldom needs to use albuterol for symptoms, only a few times per week.  Baseline she has a morning cough with some sputum production that does not persist throughout the day.  She has mild dyspnea on exertion.  She is very happy with her symptom control and does not feel that her activity is in any way limited.  Her last exacerbation was associated with influenza and pneumonia in 2015, which required hospitalization.  She  quit smoking in 2014 after 44 years of smoking 0.5 to 1 pack/day.   She has well-controlled seasonal allergies.  Mostly her symptoms are when there is a lot of pollen.  She takes Claritin and intranasal steroids as needed.  She had a low-dose flu vaccine already this year, had mild symptoms of fever and chills.  Last year she had a severe reaction to the high-dose flu vaccine-rigors, fever, dizziness.  When she was much younger she had severe reactions to flu vaccines causing nodules in her arms that required surgical removal.  She has followed with Eric Form, NP for lung cancer screening and plans to continue.   Past Medical History:  Diagnosis Date  . Allergic rhinitis   . Cancer (Early)    skin cancer. cervical - pre cancerous  . COPD (chronic obstructive pulmonary disease) ([place])   . Hypertension   . Pneumonia     2- 3 times last time 01/2013     Family History  Problem Relation Age of Onset  . Allergies Mother   . Asthma Mother      Past Surgical History:  Procedure Laterality Date  . APPENDECTOMY    . BREAST SURGERY Bilateral    Augmentation  . Cervix leap    . SKIN GRAFT Left    with tear  duct repair  . SUBMANDIBULAR GLAND EXCISION Right 11/29/2017   Procedure: EXCISION RIGHT SUBMANDIBULAR GLAND,;  Surgeon: Jodi Marble, MD;  Location: [facility];  Service: ENT;  Laterality: Right;  . TONSILLECTOMY    . tumor removed     from arms d/t cold and flu shots    Social History   Socioeconomic History  . Marital status: Married    Spouse name: Not on file  . Number of children: Y  . Years of education: Not on file  . Highest education level: Not on file  Occupational History  . 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 Smoking status: Former Smoker    Packs/day: 0.25    Years: 44.00    Pack years: 11.00    Types: Cigarettes    Quit date: 11/15/2012    Years since quitting: 6.9  . Smokeless tobacco: Never Used  . Tobacco comment:  Started smoking at age 54.    Vaping Use  . Vaping Use: Former  Substance and Sexual Activity  . Alcohol use: Yes    Alcohol/week: 14.0 standard drinks    Types: 14 Cans of beer per week  . Drug use: No  . Sexual activity: Yes  Other Topics Concern  . Not on file  Social History Narrative  . Not on file   Social Determinants of Health   Financial Resource Strain:   . Difficulty of Paying Living Expenses: Not on file  Food Insecurity:   . Worried About Charity fundraiser in the Last Year: Not on file  . Ran Out of Food in the Last Year: Not on file  Transportation Needs:   . Lack of Transportation (Medical): Not on file  . Lack of Transportation (Non-Medical): Not on file  Physical Activity:   . Days of Exercise per Week: Not on file  . Minutes of Exercise per Session: Not on file  Stress:   . Feeling of Stress : Not on file  Social Connections:   . Frequency of Communication with Friends and Family: Not on file  . Frequency of Social Gatherings with Friends and Family: Not on file  . Attends Religious Services: Not on file  . Active Member of Clubs or Organizations: Not on file  . Attends Archivist Meetings: Not on file  . Marital Status: Not on file  Intimate Partner Violence:   . Fear of Current or Ex-Partner: Not on file  . Emotionally Abused: Not on file  . Physically Abused: Not on file  . Sexually Abused: Not on file     Allergies  Allergen Reactions  . A-G Pro Other (See Comments)    Shots cause tumors in arms     Immunization History  Administered Date(s) Administered  . Influenza Whole 10/21/2018  . Influenza, High Dose Seasonal PF 12/18/2017  . Influenza,inj,Quad PF,6+ Mos 02/28/2016, 12/12/2016, 10/21/2018, 09/29/2019  . Influenza,inj,Quad PF,6-35 Mos 11/15/2016  . PFIZER SARS-COV-2 Vaccination 02/10/2019, 03/03/2019  . Pneumococcal Conjugate-13 08/02/2015, 11/15/2016  . Pneumococcal Polysaccharide-23 03/14/2010, 12/12/2016  . Tdap  04/20/2014  . Zoster 08/16/2015    Outpatient Medications Prior to Visit  Medication Sig Dispense Refill  . albuterol (PROVENTIL) (2.5 MG/3ML) 0.083% nebulizer solution USE 1 VIAL VIA NEBULIZER EVERY 6 HOURS AS NEEDED (Patient taking differently: Take 2.5 mg by nebulization every 6 (six) hours as needed for wheezing or shortness of breath. ) 75 mL 3  . albuterol (VENTOLIN HFA) 108 (90 Base) MCG/ACT inhaler Inhale 1-2 puffs into the lungs every 6 (six) hours as needed for wheezing or shortness of breath. 8.5 g 5  . amLODipine (  NORVASC) 5 MG tablet Take 5 mg by mouth at bedtime.     . flunisolide (NASALIDE) 25 MCG/ACT (0.025%) SOLN Place 1 spray into the nose 2 (two) times daily as needed (for allergies). 25 mL 6  . loratadine (CLARITIN) 10 MG tablet Take 10 mg by mouth daily as needed for allergies.     Marland Kitchen losartan (COZAAR) 50 MG tablet Take 1 tablet (50 mg total) by mouth daily. 90 tablet 3  . SYMBICORT 160-4.5 MCG/ACT inhaler INHALE 2 PUFFS INTO THE LUNGS TWICE DAILY 10.2 g 11  . tiotropium (SPIRIVA HANDIHALER) 18 MCG inhalation capsule Place 1 capsule (18 mcg total) into inhaler and inhale daily. 30 capsule 12  . Vitamin D, Ergocalciferol, (DRISDOL) 50000 UNITS CAPS Take 50,000 Units by mouth every Monday.      No facility-administered medications prior to visit.    Review of Systems  Constitutional: Negative for chills, diaphoresis, fever, malaise/fatigue and weight loss.  HENT: Positive for congestion. Negative for ear pain and sore throat.   Respiratory: Positive for cough and sputum production. Negative for hemoptysis, shortness of breath and wheezing.   Cardiovascular: Negative for chest pain, palpitations and leg swelling.  Gastrointestinal: Negative for abdominal pain, heartburn, nausea and vomiting.  Genitourinary: Negative for frequency.  Musculoskeletal: Negative for joint pain and myalgias.  Skin: Negative for itching and rash.  Neurological: Negative for dizziness, weakness  and headaches.  Endo/Heme/Allergies: Bruises/bleeds easily.  Psychiatric/Behavioral: Negative for depression. The patient is not nervous/anxious.      Objective:   Vitals:   11/05/19 0929  BP: 132/62  Pulse: 95  Temp: (!) 97.1 F (36.2 C)  TempSrc: Temporal  SpO2: 92%  Weight: 99 lb (44.9 kg)  Height: 4' 11.5" (1.511 m)   92% on   RA BMI Readings from Last 3 Encounters:  11/05/19 19.66 kg/m  11/05/18 19.26 kg/m  07/16/18 19.67 kg/m   Wt Readings from Last 3 Encounters:  11/05/19 99 lb (44.9 kg)  11/05/18 97 lb (44 kg)  07/16/18 97 lb 6.4 oz (44.2 kg)    Physical Exam Vitals reviewed.  Constitutional:      General: She is not in acute distress.    Appearance: Normal appearance. She is not ill-appearing.  HENT:     Head: Normocephalic and atraumatic.  Eyes:     General: No scleral icterus. Cardiovascular:     Rate and Rhythm: Normal rate and regular rhythm.     Heart sounds: No murmur heard.   Pulmonary:     Comments: CTAB, speaking in full sentences, no tachypnea Musculoskeletal:        General: No deformity.     Cervical back: Neck supple.  Lymphadenopathy:     Cervical: No cervical adenopathy.  Skin:    General: Skin is warm and dry.  Neurological:     General: No focal deficit present.     Mental Status: She is alert.     Coordination: Coordination normal.  Psychiatric:        Mood and Affect: Mood normal.        Behavior: Behavior normal.      CBC    Component Value Date/Time   WBC 9.9 11/25/2017 1347   RBC 4.15 11/25/2017 1347   HGB 13.9 11/25/2017 1347   HCT 42.4 11/25/2017 1347   PLT 295 11/25/2017 1347   MCV 102.2 (H) 11/25/2017 1347   MCH 33.5 11/25/2017 1347   MCHC 32.8 11/25/2017 1347   RDW 11.9 11/25/2017 1347  LYMPHSABS 0.5 (L) 02/04/2013 0743   MONOABS 0.9 02/04/2013 0743   EOSABS 0.0 02/04/2013 0743   BASOSABS 0.0 02/04/2013 0743     Chest Imaging- films reviewed: CT chest 07/16/2018- panlobular emphysema, mild airway  thickening.  Linear scars bilateral lower lobes.  Right upper lobe subpleural nodule.  Vascular calcifications.  No significant mediastinal or hilar adenopathy.  Surgical breast implants.  Pulmonary Functions Testing Results: PFT Results Latest Ref Rng & Units 12/26/2015  FVC-Pre L 2.15  FVC-Predicted Pre % 78  FVC-Post L 2.33  FVC-Predicted Post % 85  Pre FEV1/FVC % % 46  Post FEV1/FCV % % 49  FEV1-Pre L 1.00  FEV1-Predicted Pre % 47  FEV1-Post L 1.15  DLCO uncorrected ml/min/mmHg 14.82  DLCO UNC% % 75  DLCO corrected ml/min/mmHg 14.64  DLCO COR %Predicted % 74  DLVA Predicted % 75   2017 - Severe obstruction with significant bronchodilator reversibility.  Mild diffusion limitation.  Normal lung volumes.  Flow volume loop consistent with obstruction.    Assessment & Plan:     ICD-10-CM   1. Former smoker  Z87.891   2. Centrilobular emphysema (HCC)  J43.2     COPD with centrilobular emphysema- GOLD A- significant obstruction, very infrequent exacerbations. -UTD on pneumococcal, covid, and flu vaccinations -Continue Spiriva daily and Symbicort BID as prescribed.  -Continue albuterol as needed-- using very infrequently. -Continue regular physical activity to maintain exercise tolerance. -Walk in the office today to monitor for exertional desaturations-- remained 95% or greater.  History of tobacco abuse -Congratulated her on her ongoing success with quitting -Needs updated lung cancer screening- have discussed with NP Groce.  Nasal congestion -agree with trial of OTC antihistamine.  RTC in 6-12 months with Dr. Erin Fulling.   Current Outpatient Medications:  .  albuterol (PROVENTIL) (2.5 MG/3ML) 0.083% nebulizer solution, USE 1 VIAL VIA NEBULIZER EVERY 6 HOURS AS NEEDED (Patient taking differently: Take 2.5 mg by nebulization every 6 (six) hours as needed for wheezing or shortness of breath. ), Disp: 75 mL, Rfl: 3 .  albuterol (VENTOLIN HFA) 108 (90 Base) MCG/ACT inhaler,  Inhale 1-2 puffs into the lungs every 6 (six) hours as needed for wheezing or shortness of breath., Disp: 8.5 g, Rfl: 5 .  amLODipine (NORVASC) 5 MG tablet, Take 5 mg by mouth at bedtime. , Disp: , Rfl:  .  flunisolide (NASALIDE) 25 MCG/ACT (0.025%) SOLN, Place 1 spray into the nose 2 (two) times daily as needed (for allergies)., Disp: 25 mL, Rfl: 6 .  loratadine (CLARITIN) 10 MG tablet, Take 10 mg by mouth daily as needed for allergies. , Disp: , Rfl:  .  losartan (COZAAR) 50 MG tablet, Take 1 tablet (50 mg total) by mouth daily., Disp: 90 tablet, Rfl: 3 .  SYMBICORT 160-4.5 MCG/ACT inhaler, INHALE 2 PUFFS INTO THE LUNGS TWICE DAILY, Disp: 10.2 g, Rfl: 11 .  tiotropium (SPIRIVA HANDIHALER) 18 MCG inhalation capsule, Place 1 capsule (18 mcg total) into inhaler and inhale daily., Disp: 30 capsule, Rfl: 12 .  Vitamin D, Ergocalciferol, (DRISDOL) 50000 UNITS CAPS, Take 50,000 Units by mouth every Monday. , Disp: , Rfl:    Julian Hy, DO [facility] 11/05/2019 10:00 AM

## 2019-11-05 NOTE — Patient Instructions (Addendum)
Thank you for visiting Dr. Carlis Abbott at [facility]. We recommend the following:  Stay on your Spiriva and Symbicort.    Return in about 6 months (around 05/05/2020), or if symptoms worsen or fail to improve. with Dr. Erin Fulling (30 minutes visit).    Please do your part to reduce the spread of COVID-19.

## 2019-11-06 ENCOUNTER — Other Ambulatory Visit: Payer: Self-pay | Admitting: *Deleted

## 2019-11-06 DIAGNOSIS — Z87891 Personal history of nicotine dependence: Secondary | ICD-10-CM

## 2019-11-09 LAB — EXTERNAL GENERIC LAB PROCEDURE: COLOGUARD: NEGATIVE

## 2019-12-01 ENCOUNTER — Ambulatory Visit (HOSPITAL_BASED_OUTPATIENT_CLINIC_OR_DEPARTMENT_OTHER): Payer: Medicare Other

## 2019-12-08 ENCOUNTER — Other Ambulatory Visit: Payer: Self-pay

## 2019-12-08 ENCOUNTER — Ambulatory Visit (HOSPITAL_BASED_OUTPATIENT_CLINIC_OR_DEPARTMENT_OTHER)
Admission: RE | Admit: 2019-12-08 | Discharge: 2019-12-08 | Disposition: A | Discharge status: Home / Self Care | Payer: Medicare Other | Source: Ambulatory Visit | Attending: Acute Care | Admitting: Acute Care

## 2019-12-08 DIAGNOSIS — Z87891 Personal history of nicotine dependence: Secondary | ICD-10-CM | POA: Insufficient documentation

## 2019-12-08 NOTE — Progress Notes (Signed)

## 2019-12-09 ENCOUNTER — Other Ambulatory Visit: Payer: Self-pay | Admitting: *Deleted

## 2019-12-09 DIAGNOSIS — Z87891 Personal history of nicotine dependence: Secondary | ICD-10-CM

## 2020-03-08 ENCOUNTER — Other Ambulatory Visit: Payer: Self-pay | Admitting: Critical Care Medicine

## 2020-03-08 DIAGNOSIS — J432 Centrilobular emphysema: Secondary | ICD-10-CM

## 2020-04-04 ENCOUNTER — Ambulatory Visit (INDEPENDENT_AMBULATORY_CARE_PROVIDER_SITE_OTHER): Payer: Medicare Other | Admitting: Pulmonary Disease

## 2020-04-04 ENCOUNTER — Encounter: Payer: Self-pay | Admitting: Pulmonary Disease

## 2020-04-04 ENCOUNTER — Other Ambulatory Visit: Payer: Self-pay

## 2020-04-04 VITALS — BP 122/76 | HR 98 | Temp 98.4°F | Ht 59.5 in | Wt 98.2 lb

## 2020-04-04 DIAGNOSIS — J432 Centrilobular emphysema: Secondary | ICD-10-CM | POA: Diagnosis not present

## 2020-04-04 DIAGNOSIS — Z87891 Personal history of nicotine dependence: Secondary | ICD-10-CM | POA: Diagnosis not present

## 2020-04-04 NOTE — Patient Instructions (Signed)
Please give [place] a call if you need to be seen sooner or need refills on any medications.  Continue spiriva and symbicort daily

## 2020-04-04 NOTE — Progress Notes (Signed)
Synopsis: Referred for COPD by Amber Bill, MD.  Previously a patient of Dr. Lenna Gilford and Dr. Carlis Abbott.  Subjective:   PATIENT ID: Amber Chaney GENDER: female DOB: [birth year], MRN: [id number]   HPI  Chief Complaint  Patient presents with  . New Patient (Initial Visit)    Follow up   Amber Chaney is a 70 year old woman, former smoker with COPD who returns to pulmonary clinic for follow up.   She has been doing well since last visit with Dr. Carlis Abbott. She reports no changes in her respiratory symptoms. She has congestion and cough in the morning which clears and is not an issue the remainder of the day.   She continues on Spiriva daily and Symbicort twice daily. She is enrolled in our lung cancer screening program and last scan was 12/08/19 with no changes in the tiny nodules reported on previous scans.   OV 11/05/19: [name] a 70 year old woman with a history of COPD who presents for follow-up.  She continues on daily Spiriva and twice daily Symbicort.  She feels like her medication regimen is optimized.  She very infrequently has exacerbations.  She brought albuterol nebulizers that she has not used, both which expired 2 to 3 years ago.  Her only side effect from her medications is occasionally having dry mouth from Spiriva.  She denies wheezing, sputum production change.  She has occasional cough in the morning due to nasal congestion that she has overnight, which always clears by midday.  She has dyspnea on exertion when walking up hills, but can walk more than a mile on flat ground.  She walks more than a mile about 3 to 4 days/week for exercise.  She is up-to-date on flu shot (gets low-dose after previously having a bad reaction to high-dose), Covid, pneumonia shots.  She has not previously tried taking any medication for her nasal congestion.  Past Medical History:  Diagnosis Date  . Allergic rhinitis   . Cancer ([place])    skin cancer. cervical - pre cancerous  . COPD  (chronic obstructive pulmonary disease) ([place])   . Hypertension   . Pneumonia     2- 3 times last time 01/2013     Family History  Problem Relation Age of Onset  . Allergies Mother   . Asthma Mother      Social History   Socioeconomic History  . Marital status: Married    Spouse name: Not on file  . Number of children: Y  . Years of education: Not on file  . Highest education level: Not on file  Occupational History  . 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 Smoking status: Former Smoker    Packs/day: 0.25    Years: 44.00    Pack years: 11.00    Types: Cigarettes    Quit date: 11/15/2012    Years since quitting: 7.3  . Smokeless tobacco: Never Used  . Tobacco comment: Started smoking at age 26.    Vaping Use  . Vaping Use: Former  Substance and Sexual Activity  . Alcohol use: Yes    Alcohol/week: 14.0 standard drinks    Types: 14 Cans of beer per week  . Drug use: No  . Sexual activity: Yes  Other Topics Concern  . Not on file  Social History Narrative  . Not on file   Social Determinants of Health   Financial Resource Strain: Not on file  Food Insecurity:  Not on file  Transportation Needs: Not on file  Physical Activity: Not on file  Stress: Not on file  Social Connections: Not on file  Intimate Partner Violence: Not on file     Allergies  Allergen Reactions  . A-G Pro Other (See Comments)    Shots cause tumors in arms     Outpatient Medications Prior to Visit  Medication Sig Dispense Refill  . albuterol (PROVENTIL) (2.5 MG/3ML) 0.083% nebulizer solution USE 1 VIAL VIA NEBULIZER EVERY 6 HOURS AS NEEDED (Patient taking differently: Take 2.5 mg by nebulization every 6 (six) hours as needed for wheezing or shortness of breath.) 75 mL 3  . albuterol (VENTOLIN HFA) 108 (90 Base) MCG/ACT inhaler Inhale 1-2 puffs into the lungs every 6 (six) hours as needed for wheezing or shortness of breath. 8.5 g 5  . amLODipine (NORVASC) 5  MG tablet Take 5 mg by mouth at bedtime.     . flunisolide (NASALIDE) 25 MCG/ACT (0.025%) SOLN Place 1 spray into the nose 2 (two) times daily as needed (for allergies). 25 mL 6  . loratadine (CLARITIN) 10 MG tablet Take 10 mg by mouth daily as needed for allergies.     Marland Kitchen losartan (COZAAR) 50 MG tablet Take 1 tablet (50 mg total) by mouth daily. 90 tablet 3  . SPIRIVA HANDIHALER 18 MCG inhalation capsule INHALE CONTENTS OF 1 CAPSULE ONCE DAILY USING HANDIHALER 30 capsule 12  . SYMBICORT 160-4.5 MCG/ACT inhaler INHALE 2 PUFFS INTO THE LUNGS TWICE DAILY 10.2 g 11  . Vitamin D, Ergocalciferol, (DRISDOL) 50000 UNITS CAPS Take 50,000 Units by mouth every Monday.     No facility-administered medications prior to visit.    Review of Systems  Constitutional: Negative for chills, fever, malaise/fatigue and weight loss.  HENT: Negative for congestion, sinus pain and sore throat.   Eyes: Negative.   Respiratory: Positive for sputum production (in AM). Negative for cough, hemoptysis, shortness of breath and wheezing.   Cardiovascular: Negative for chest pain, palpitations, orthopnea, claudication and leg swelling.  Gastrointestinal: Negative for abdominal pain, heartburn, nausea and vomiting.  Genitourinary: Negative.   Musculoskeletal: Negative for joint pain and myalgias.  Skin: Negative for rash.  Neurological: Negative for weakness.  Endo/Heme/Allergies: Negative.   Psychiatric/Behavioral: Negative.       Objective:   Vitals:   04/04/20 1024  BP: 122/76  Pulse: 98  Temp: 98.4 F (36.9 C)  TempSrc: Oral  SpO2: 97%  Weight: 98 lb 3.2 oz (44.5 kg)  Height: 4' 11.5" (1.511 m)     Physical Exam Constitutional:      General: She is not in acute distress.    Appearance: She is not ill-appearing.  HENT:     Head: Normocephalic and atraumatic.     Nose: Nose normal.     Mouth/Throat:     Mouth: Mucous membranes are moist.     Pharynx: Oropharynx is clear.  Eyes:     General: No  scleral icterus.    Conjunctiva/sclera: Conjunctivae normal.     Pupils: Pupils are equal, round, and reactive to light.  Cardiovascular:     Rate and Rhythm: Normal rate and regular rhythm.     Pulses: Normal pulses.     Heart sounds: Normal heart sounds. No murmur heard.   Pulmonary:     Effort: Pulmonary effort is normal.     Breath sounds: Normal breath sounds. No wheezing, rhonchi or rales.  Abdominal:     General: Bowel sounds are normal.  Palpations: Abdomen is soft.  Musculoskeletal:     Right lower leg: No edema.     Left lower leg: No edema.  Lymphadenopathy:     Cervical: No cervical adenopathy.  Skin:    General: Skin is warm and dry.  Neurological:     General: No focal deficit present.     Mental Status: She is alert.  Psychiatric:        Mood and Affect: Mood normal.        Behavior: Behavior normal.        Thought Content: Thought content normal.        Judgment: Judgment normal.     CBC    Component Value Date/Time   WBC 9.9 11/25/2017 1347   RBC 4.15 11/25/2017 1347   HGB 13.9 11/25/2017 1347   HCT 42.4 11/25/2017 1347   PLT 295 11/25/2017 1347   MCV 102.2 (H) 11/25/2017 1347   MCH 33.5 11/25/2017 1347   MCHC 32.8 11/25/2017 1347   RDW 11.9 11/25/2017 1347   LYMPHSABS 0.5 (L) 02/04/2013 0743   MONOABS 0.9 02/04/2013 0743   EOSABS 0.0 02/04/2013 0743   BASOSABS 0.0 02/04/2013 0743   BMP Latest Ref Rng & Units 11/25/2017 02/05/2013 02/04/2013  Glucose 70 - 99 mg/dL 91 171(H) 122(H)  BUN 8 - 23 mg/dL 7(L) 12 7  Creatinine 0.44 - 1.00 mg/dL 0.69 0.81 0.78  Sodium 135 - 145 mmol/L 129(L) 131(L) 131(L)  Potassium 3.5 - 5.1 mmol/L 4.1 4.2 4.6  Chloride 98 - 111 mmol/L 97(L) 95(L) 95(L)  CO2 22 - 32 mmol/L 23 22 21   Calcium 8.9 - 10.3 mg/dL 9.5 9.2 8.8   Chest imaging: CXR 12/08/19: reviewed centrilobular and paraseptal emphysema and biapical pleuroparenchymal scarring noted. Tiny pulmonary nodules are stable.  PFT: PFT Results Latest Ref  Rng & Units 12/26/2015  FVC-Pre L 2.15  FVC-Predicted Pre % 78  FVC-Post L 2.33  FVC-Predicted Post % 85  Pre FEV1/FVC % % 46  Post FEV1/FCV % % 49  FEV1-Pre L 1.00  FEV1-Predicted Pre % 47  FEV1-Post L 1.15  DLCO uncorrected ml/min/mmHg 14.82  DLCO UNC% % 75  DLCO corrected ml/min/mmHg 14.64  DLCO COR %Predicted % 74  DLVA Predicted % 75  2017 - Severe obstruction with significant bronchodilator reversibility.  Mild diffusion limitation.  Normal lung volumes.  Flow volume loop consistent with obstruction.     Assessment & Plan:   Centrilobular emphysema ([place])  Former smoker  Discussion: Adell Koval is a 70 year old woman, former smoker with COPD who returns to pulmonary clinic for follow up.   She continues to do well on Spiriva daily and Symbicort twice daily. She is to remain on this inhaler regimen.   She is to continue follow up in our lung cancer screening program.   Follow up in 1 year. Follow up sooner if needed.  Freda Jackson, MD [facility] Office: [phone]    Current Outpatient Medications:  .  albuterol (PROVENTIL) (2.5 MG/3ML) 0.083% nebulizer solution, USE 1 VIAL VIA NEBULIZER EVERY 6 HOURS AS NEEDED (Patient taking differently: Take 2.5 mg by nebulization every 6 (six) hours as needed for wheezing or shortness of breath.), Disp: 75 mL, Rfl: 3 .  albuterol (VENTOLIN HFA) 108 (90 Base) MCG/ACT inhaler, Inhale 1-2 puffs into the lungs every 6 (six) hours as needed for wheezing or shortness of breath., Disp: 8.5 g, Rfl: 5 .  amLODipine (NORVASC) 5 MG tablet, Take 5 mg  by mouth at bedtime. , Disp: , Rfl:  .  flunisolide (NASALIDE) 25 MCG/ACT (0.025%) SOLN, Place 1 spray into the nose 2 (two) times daily as needed (for allergies)., Disp: 25 mL, Rfl: 6 .  loratadine (CLARITIN) 10 MG tablet, Take 10 mg by mouth daily as needed for allergies. , Disp: , Rfl:  .  losartan (COZAAR) 50 MG tablet, Take 1 tablet (50 mg total) by mouth  daily., Disp: 90 tablet, Rfl: 3 .  SPIRIVA HANDIHALER 18 MCG inhalation capsule, INHALE CONTENTS OF 1 CAPSULE ONCE DAILY USING HANDIHALER, Disp: 30 capsule, Rfl: 12 .  SYMBICORT 160-4.5 MCG/ACT inhaler, INHALE 2 PUFFS INTO THE LUNGS TWICE DAILY, Disp: 10.2 g, Rfl: 11 .  Vitamin D, Ergocalciferol, (DRISDOL) 50000 UNITS CAPS, Take 50,000 Units by mouth every Monday., Disp: , Rfl:

## 2020-08-12 ENCOUNTER — Other Ambulatory Visit: Payer: Self-pay | Admitting: Critical Care Medicine

## 2020-12-30 ENCOUNTER — Other Ambulatory Visit: Payer: Self-pay | Admitting: *Deleted

## 2020-12-30 DIAGNOSIS — Z87891 Personal history of nicotine dependence: Secondary | ICD-10-CM

## 2021-02-06 ENCOUNTER — Ambulatory Visit (HOSPITAL_BASED_OUTPATIENT_CLINIC_OR_DEPARTMENT_OTHER)
Admission: RE | Admit: 2021-02-06 | Discharge: 2021-02-06 | Disposition: A | Discharge status: Home / Self Care | Payer: Medicare Other | Source: Ambulatory Visit | Attending: Acute Care | Admitting: Acute Care

## 2021-02-06 ENCOUNTER — Other Ambulatory Visit: Payer: Self-pay

## 2021-02-06 DIAGNOSIS — J439 Emphysema, unspecified: Secondary | ICD-10-CM | POA: Insufficient documentation

## 2021-02-06 DIAGNOSIS — I700 Atherosclerosis of aorta: Secondary | ICD-10-CM | POA: Diagnosis not present

## 2021-02-06 DIAGNOSIS — Z87891 Personal history of nicotine dependence: Secondary | ICD-10-CM | POA: Diagnosis present

## 2021-02-06 DIAGNOSIS — I2510 Atherosclerotic heart disease of native coronary artery without angina pectoris: Secondary | ICD-10-CM | POA: Diagnosis not present

## 2021-02-07 ENCOUNTER — Other Ambulatory Visit: Payer: Self-pay

## 2021-02-07 DIAGNOSIS — Z87891 Personal history of nicotine dependence: Secondary | ICD-10-CM

## 2021-03-04 ENCOUNTER — Other Ambulatory Visit: Payer: Self-pay | Admitting: Adult Health

## 2021-03-13 ENCOUNTER — Other Ambulatory Visit: Payer: Self-pay | Admitting: Adult Health

## 2021-03-13 DIAGNOSIS — J432 Centrilobular emphysema: Secondary | ICD-10-CM

## 2021-04-04 ENCOUNTER — Ambulatory Visit (INDEPENDENT_AMBULATORY_CARE_PROVIDER_SITE_OTHER): Payer: Medicare Other | Admitting: Pulmonary Disease

## 2021-04-04 ENCOUNTER — Other Ambulatory Visit: Payer: Self-pay

## 2021-04-04 ENCOUNTER — Encounter: Payer: Self-pay | Admitting: Pulmonary Disease

## 2021-04-04 VITALS — BP 138/70 | HR 62 | Ht 59.5 in | Wt 95.6 lb

## 2021-04-04 DIAGNOSIS — Z87891 Personal history of nicotine dependence: Secondary | ICD-10-CM

## 2021-04-04 DIAGNOSIS — J432 Centrilobular emphysema: Secondary | ICD-10-CM | POA: Diagnosis not present

## 2021-04-04 MED ORDER — ALBUTEROL SULFATE (2.5 MG/3ML) 0.083% IN NEBU: INHALATION_SOLUTION | RESPIRATORY_TRACT | 3 refills | Status: DC

## 2021-04-04 NOTE — Progress Notes (Signed)
? ?Synopsis: Referred for COPD by Bartholome Bill, MD.  Previously a patient of Dr. Lenna Gilford and Dr. Carlis Abbott. ? ?Subjective:  ? ?PATIENT ID: Amber Chaney GENDER: female DOB: [birth year], MRN: [id number] ? ?HPI ? ?Chief Complaint  ?Patient presents with  ? Follow-up  ?  2yrf/u for emphysema. States her breathing has been stable since last visit, no complaints.   ? ?Amber Chaney a 71year old Chaney, Amber smoker with COPD who returns to pulmonary clinic for follow up.  ? ?She continues on Spiriva daily and Symbicort twice daily. No need for prednisone in the last year.  ? ?She is having cough and mucous production in the mornings. She reports her spring allergies are picking up but usually well controlled with OTC medications. ? ?OV 04/04/20 ?She has been doing well since last visit with Dr. CCarlis Abbott She reports no changes in her respiratory symptoms. She has congestion and cough in the morning which clears and is not an issue the remainder of the day.  ? ?She continues on Spiriva daily and Symbicort twice daily. She is enrolled in our lung cancer screening program and last scan was 12/08/19 with no changes in the tiny nodules reported on previous scans.  ? ?OV 11/05/19: ?Amber Chaney a 71year old Chaney with a history of COPD who presents for follow-up.  She continues on daily Spiriva and twice daily Symbicort.  She feels like her medication regimen is optimized.  She very infrequently has exacerbations.  She brought albuterol nebulizers that she has not used, both which expired 2 to 3 years ago.  Her only side effect from her medications is occasionally having dry mouth from Spiriva.  She denies wheezing, sputum production change.  She has occasional cough in the morning due to nasal congestion that she has overnight, which always clears by midday.  She has dyspnea on exertion when walking up hills, but can walk more than a mile on flat ground.  She walks more than a mile about 3 to 4 days/week for exercise.   She is up-to-date on flu shot (gets low-dose after previously having a bad reaction to high-dose), Covid, pneumonia shots.  She has not previously tried taking any medication for her nasal congestion. ? 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  ? ?Past Medical History:  ?Diagnosis Date  ? Allergic rhinitis   ? Cancer ([facility]   ? skin cancer. cervical - pre cancerous  ? [facility] [place]   ? Hypertension   ? Pneumonia   ?  2- 3 times last time 01/2013  ?  ? ?Family History  ?Problem Relation Age of Onset  ? Allergies Mother   ? Asthma Mother   ?  ? ?Social History  ? ?Socioeconomic History  ? Marital status: Married  ?  Spouse name: Not on file  ? Number of children: Y  ? Years of education: Not on file  ? Highest education level: Not on file  ?Occupational History  ? 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  Employer: [employer] ?Tobacco Use  ? Smoking status: Amber  ?  Packs/day: 0.25  ?  Years: 44.00  ?  Pack years: 11.00  ?  Types: Cigarettes  ?  Quit date: 11/15/2012  ?  Years since quitting: 8.3  ? Smokeless tobacco: Never  ? Tobacco comments:  ?  Started smoking at age 71    ?Vaping Use  ? Vaping Use: Amber  ?Substance and Sexual  Activity  ? Alcohol use: Yes  ?  Alcohol/week: 14.0 standard drinks  ?  Types: 14 Cans of beer per week  ? Drug use: No  ? Sexual activity: Yes  ?Other Topics Concern  ? Not on file  ?Social History Narrative  ? Not on file  ? ?Social Determinants of Health  ? ?Financial Resource Strain: Not on file  ?Food Insecurity: Not on file  ?Transportation Needs: Not on file  ?Physical Activity: Not on file  ?Stress: Not on file  ?Social Connections: Not on file  ?Intimate Partner Violence: Not on file  ?  ? ?Allergies  ?Allergen Reactions  ? A-G Pro Other (See Comments)  ?  Shots cause tumors in arms  ?  ? ?Outpatient Medications Prior to Visit  ?Medication Sig Dispense Refill  ? albuterol (VENTOLIN HFA) 108 (90 Base) MCG/ACT inhaler INHALE 1  TO 2 PUFFS INTO THE LUNGS EVERY 6 HOURS AS NEEDED FOR WHEEZING OR SHORTNESS OF BREATH 8.5 g 5  ? amLODipine (NORVASC) 5 MG tablet Take 5 mg by mouth at bedtime.     ? flunisolide (NASALIDE) 25 MCG/ACT (0.025%) SOLN USE 1 SPRAY IN EACH NOSTRIL TWICE DAILY AS NEEDED FOR ALLERGIES 25 mL 5  ? loratadine (CLARITIN) 10 MG tablet Take 10 mg by mouth daily as needed for allergies.     ? losartan (COZAAR) 50 MG tablet Take 1 tablet (50 mg total) by mouth daily. 90 tablet 3  ? SPIRIVA HANDIHALER 18 MCG inhalation capsule INHALE THE CONTENTS OF 1 CAPSULE VIA INHALATION DEVICE EVERY DAY 30 capsule 12  ? SYMBICORT 160-4.5 MCG/ACT inhaler INHALE 2 PUFFS INTO THE LUNGS TWICE DAILY 10.2 g 11  ? Vitamin D, Ergocalciferol, (DRISDOL) 50000 UNITS CAPS Take 50,000 Units by mouth every Monday.    ? albuterol (PROVENTIL) (2.5 MG/3ML) 0.083% nebulizer solution USE 1 VIAL VIA NEBULIZER EVERY 6 HOURS AS NEEDED (Patient taking differently: Take 2.5 mg by nebulization every 6 (six) hours as needed for wheezing or shortness of breath.) 75 mL 3  ? ?No facility-administered medications prior to visit.  ? ? ?Review of Systems  ?Constitutional:  Negative for chills, fever, malaise/fatigue and weight loss.  ?HENT:  Negative for congestion, sinus pain and sore throat.   ?Eyes: Negative.   ?Respiratory:  Positive for cough and sputum production (in AM). Negative for hemoptysis, shortness of breath and wheezing.   ?Cardiovascular:  Negative for chest pain, palpitations, orthopnea, claudication and leg swelling.  ?Gastrointestinal:  Negative for abdominal pain, heartburn, nausea and vomiting.  ?Genitourinary: Negative.   ?Musculoskeletal:  Negative for joint pain and myalgias.  ?Skin:  Negative for rash.  ?Neurological:  Negative for weakness.  ?Endo/Heme/Allergies: Negative.   ?Psychiatric/Behavioral: Negative.    ? ?Objective:  ? ?Vitals:  ? 04/04/21 1044  ?BP: 138/70  ?Pulse: 62  ?SpO2: 97%  ?Weight: 95 lb 9.6 oz (43.4 kg)  ?Height: 4' 11.5" (1.511  m)  ? ?Physical Exam ?Constitutional:   ?   General: She is not in acute distress. ?   Appearance: She is not ill-appearing.  ?HENT:  ?   Head: Normocephalic and atraumatic.  ?   Nose: Nose normal.  ?   Mouth/Throat:  ?   Mouth: Mucous membranes are moist.  ?   Pharynx: Oropharynx is clear.  ?Eyes:  ?   General: No scleral icterus. ?   Conjunctiva/sclera: Conjunctivae normal.  ?Cardiovascular:  ?   Rate and Rhythm: Normal rate and regular rhythm.  ?  Pulses: Normal pulses.  ?   Heart sounds: Normal heart sounds. No murmur heard. ?Pulmonary:  ?   Effort: Pulmonary effort is normal.  ?   Breath sounds: Normal breath sounds. No wheezing, rhonchi or rales.  ?Musculoskeletal:  ?   Right lower leg: No edema.  ?   Left lower leg: No edema.  ?Skin: ?   General: Skin is warm and dry.  ?Neurological:  ?   General: No focal deficit present.  ?   Mental Status: She is alert.  ?Psychiatric:     ?   Mood and Affect: Mood normal.     ?   Behavior: Behavior normal.     ?   Thought Content: Thought content normal.     ?   Judgment: Judgment normal.  ? ? ?CBC ?   ?Component Value Date/Time  ? WBC 9.9 11/25/2017 1347  ? RBC 4.15 11/25/2017 1347  ? HGB 13.9 11/25/2017 1347  ? HCT 42.4 11/25/2017 1347  ? PLT 295 11/25/2017 1347  ? MCV 102.2 (H) 11/25/2017 1347  ? MCH 33.5 11/25/2017 1347  ? MCHC 32.8 11/25/2017 1347  ? RDW 11.9 11/25/2017 1347  ? LYMPHSABS 0.5 (L) 02/04/2013 0743  ? MONOABS 0.9 02/04/2013 0743  ? EOSABS 0.0 02/04/2013 0743  ? BASOSABS 0.0 02/04/2013 0743  ? ?BMP Latest Ref Rng & Units 11/25/2017 02/05/2013 02/04/2013  ?Glucose 70 - 99 mg/dL 91 171(H) 122(H)  ?BUN 8 - 23 mg/dL 7(L) 12 7  ?Creatinine 0.44 - 1.00 mg/dL 0.69 0.81 0.78  ?Sodium 135 - 145 mmol/L 129(L) 131(L) 131(L)  ?Potassium 3.5 - 5.1 mmol/L 4.1 4.2 4.6  ?Chloride 98 - 111 mmol/L 97(L) 95(L) 95(L)  ?CO2 22 - 32 mmol/L '23 22 21  '$ ?Calcium 8.9 - 10.3 mg/dL 9.5 9.2 8.8  ? ?Chest imaging: ?CT Chest LCS 02/06/21 ?Mediastinum/Nodes: No pathologically enlarged lymph  nodes seen in ?the chest. Mildly patulous esophagus. ?  ?Lungs/Pleura: Central airways are patent. Scattered areas of ?bronchial wall thickening and mucous plugging seen primarily in the ?lower lungs, li

## 2021-04-04 NOTE — Patient Instructions (Signed)
Please give [place] a call if you need to be seen sooner or need refills on any medications. ?  ?Continue spiriva and symbicort daily ? ?Follow up in 1 year ?

## 2021-07-04 IMAGING — CT CT CHEST LUNG CANCER SCREENING LOW DOSE W/O CM
1 of 2 series · 10 of 20 positions shown, 13 images · non-contrast
Comparison: 07/16/2018

CLINICAL DATA: 69-year-old female with 32 pack-year history of
smoking. Lung cancer screening.

EXAM:
CT CHEST WITHOUT CONTRAST LOW-DOSE FOR LUNG CANCER SCREENING
TECHNIQUE: Multidetector CT imaging of the chest was performed following the
standard protocol without IV contrast.

[ct lung segmentation data · axial · 0.66mm/px · z∈[-322,-322]mm · 10 of 311 frames shown]
[frame 1/311  mediastinal]
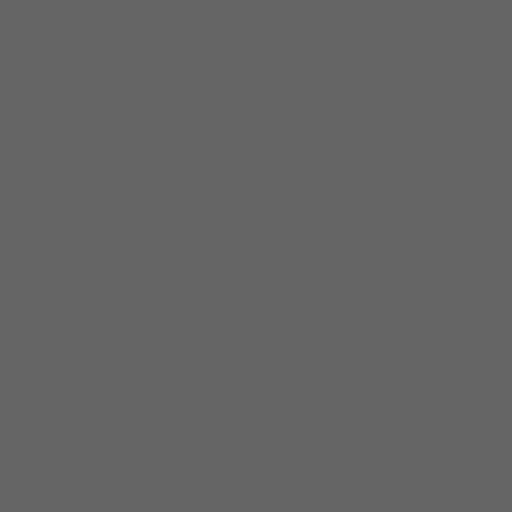
[frame 1/311  lung]
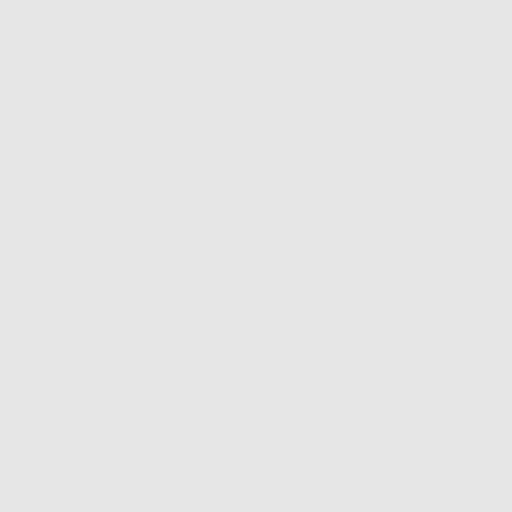
[frame 35/311  lung]
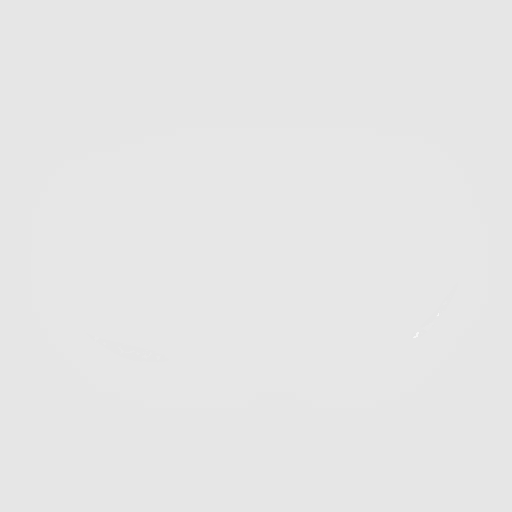
[frame 69/311  lung]
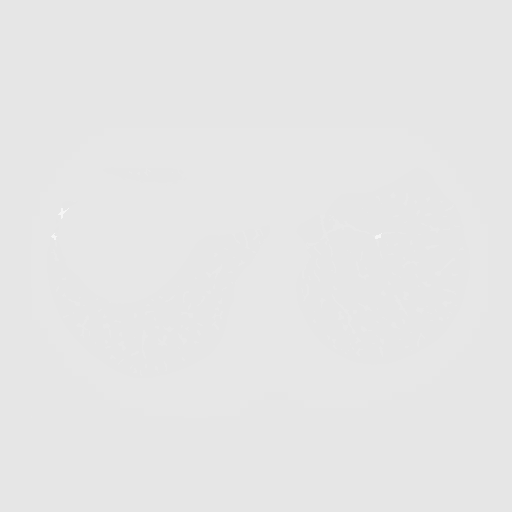
[frame 104/311  lung]
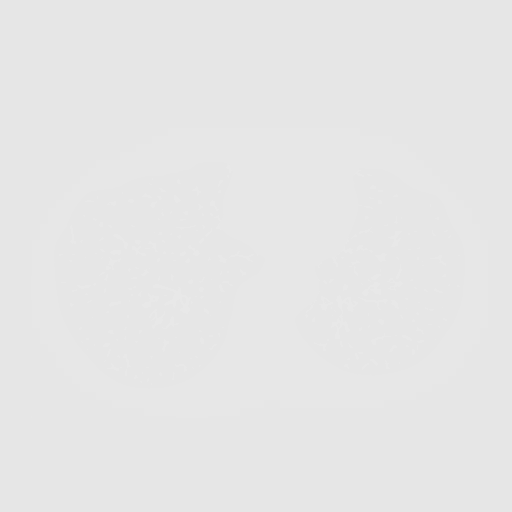
[frame 138/311  mediastinal]
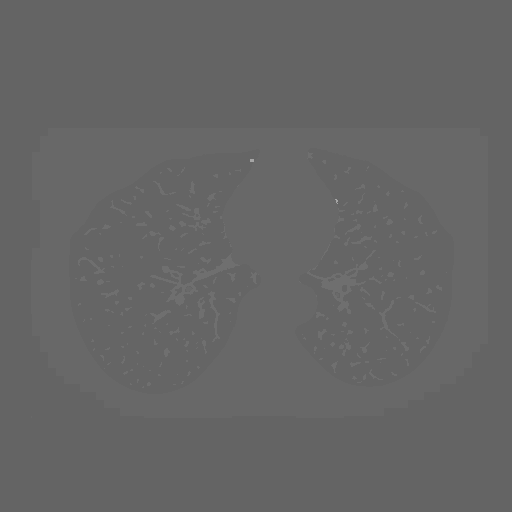
[frame 138/311  lung]
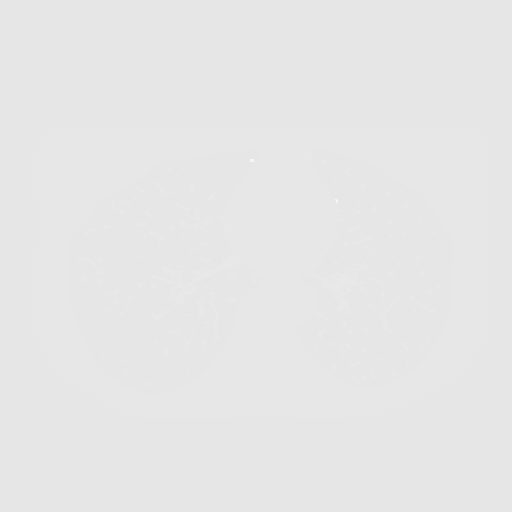
[frame 173/311  lung]
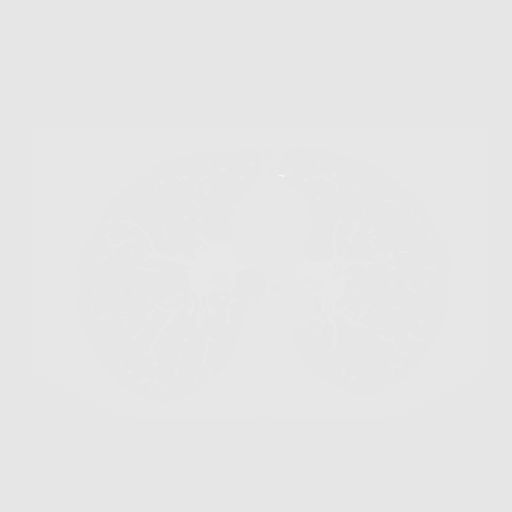
[frame 207/311  lung]
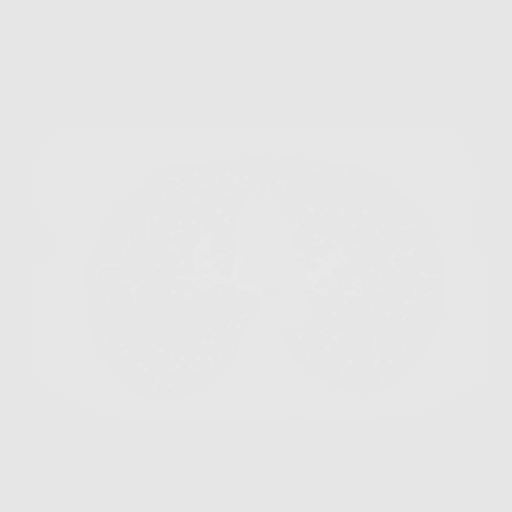
[frame 242/311  lung]
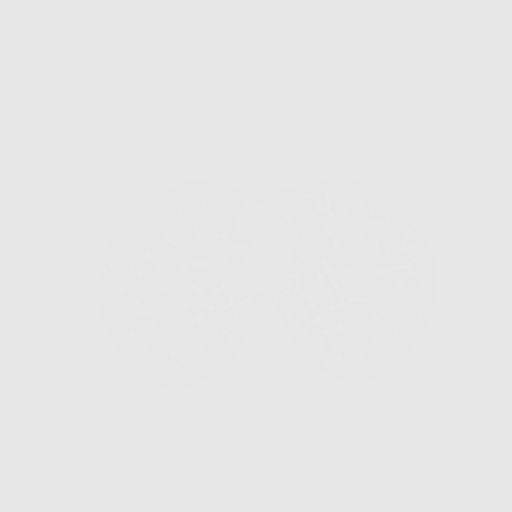
[frame 276/311  mediastinal]
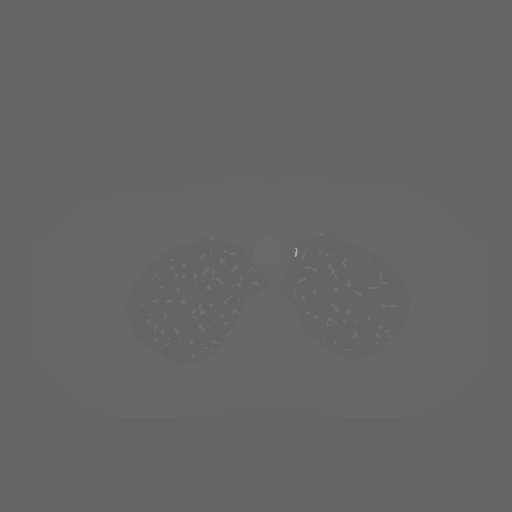
[frame 276/311  lung]
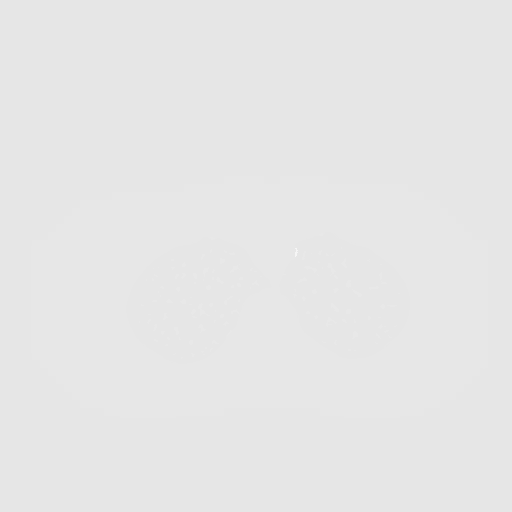
[frame 311/311  lung]
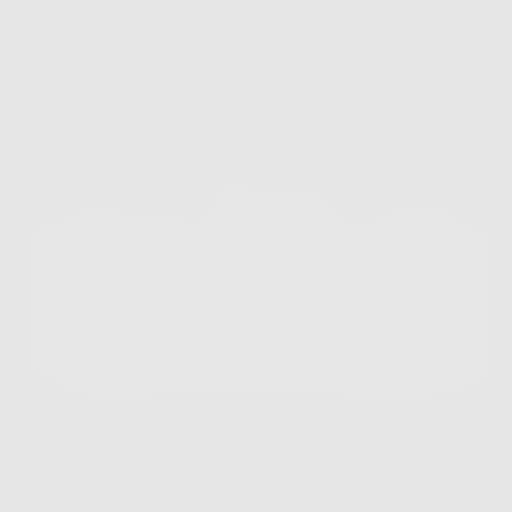

[10 of 20 positions shown; findings below may reference images not displayed]

FINDINGS: Cardiovascular: The heart size is normal. No substantial pericardial
effusion. Coronary artery calcification is evident. Atherosclerotic
calcification is noted in the wall of the thoracic aorta.

Mediastinum/Nodes: No mediastinal lymphadenopathy. No evidence for
gross hilar lymphadenopathy although assessment is limited by the
lack of intravenous contrast on today's study. The esophagus has
normal imaging features. There is no axillary lymphadenopathy.

Lungs/Pleura: Centrilobular and paraseptal emphysema evident.
Biapical pleuroparenchymal scarring noted. Previously identified
tiny pulmonary nodules are stable in the interval. No new suspicious
pulmonary nodule or mass. No focal airspace consolidation. No
pleural effusion.

Upper Abdomen: Unremarkable.

Musculoskeletal: No worrisome lytic or sclerotic osseous
abnormality.
IMPRESSION: 1. Lung-RADS 2, benign appearance or behavior. Continue annual
screening with low-dose chest CT without contrast in 12 months.
2. Aortic Atherosclerosis (MK97I-J4J.J) and Emphysema ([id number].7).

## 2022-02-07 ENCOUNTER — Telehealth: Payer: Self-pay | Admitting: Acute Care

## 2022-02-07 ENCOUNTER — Ambulatory Visit (HOSPITAL_BASED_OUTPATIENT_CLINIC_OR_DEPARTMENT_OTHER)
Admission: RE | Admit: 2022-02-07 | Discharge: 2022-02-07 | Disposition: A | Discharge status: Home / Self Care | Payer: Medicare Other | Source: Ambulatory Visit | Attending: Acute Care | Admitting: Acute Care

## 2022-02-07 DIAGNOSIS — Z87891 Personal history of nicotine dependence: Secondary | ICD-10-CM | POA: Insufficient documentation

## 2022-02-07 NOTE — Telephone Encounter (Signed)
Called and spoke with Tiffany from [place]. She called for the patient's lung cancer screening CT. Below is a copy of the impression:   "IMPRESSION: 1. Lung-RADS 4BS, suspicious. Additional imaging evaluation or consultation with Pulmonology or Thoracic Surgery recommended. 2. The "S" modifier above refers to potentially clinically significant non lung cancer related findings. Specifically, there is aortic atherosclerosis, in addition to two-vessel coronary artery disease. Please note that although the presence of coronary artery calcium documents the presence of coronary artery disease, the severity of this disease and any potential stenosis cannot be assessed on this non-gated CT examination. Assessment for potential risk factor modification, dietary therapy or pharmacologic therapy may be warranted, if clinically indicated. 3. Mild diffuse bronchial wall thickening with mild centrilobular and paraseptal emphysema; imaging findings suggestive of underlying COPD. 4. There are calcifications of the aortic valve. Echocardiographic correlation for evaluation of potential valvular dysfunction may be warranted if clinically indicated.   These results will be called to the ordering clinician or representative by the Radiologist Assistant, and communication documented in the PACS or [employer].   Aortic Atherosclerosis (ICD10-I70.0) and Emphysema (ICD10-J43.9).     Electronically Signed   By: Vinnie Langton M.D.   On: 02/07/2022 13:13"  Judson Roch, can you please advise? Thanks!

## 2022-02-08 ENCOUNTER — Telehealth: Payer: Self-pay | Admitting: Acute Care

## 2022-02-08 ENCOUNTER — Other Ambulatory Visit: Payer: Self-pay | Admitting: Acute Care

## 2022-02-08 DIAGNOSIS — R918 Other nonspecific abnormal finding of lung field: Secondary | ICD-10-CM

## 2022-02-08 DIAGNOSIS — J209 Acute bronchitis, unspecified: Secondary | ICD-10-CM

## 2022-02-08 MED ORDER — DOXYCYCLINE HYCLATE 100 MG PO TABS: 100.0000 mg | ORAL_TABLET | Freq: Two times a day (BID) | ORAL | 0 refills | Status: DC

## 2022-02-08 NOTE — Telephone Encounter (Signed)
I have called the patient with the results of her low-dose screening CT.  Her scan was read as a lung RADS 4B, there are multiple new pulmonary nodules most concerning in the right lower lobe several of which are aggressive appearing lesions.  The largest is 15.2 mm. I explained to the patient that next best step would be to do a PET scan to better evaluate these nodules. While talking to her on the phone she was very congested. Upon questioning she stated that she is coughing up yellow-green secretions however states that she gets this frequently and it usually clears on its own. For the sake of getting an accurate look at her lungs with PET scan I went ahead and sent in doxycycline 100 mg twice daily x 7 days. I reviewed with her to separate medication by 2 hours with dairy products I have also asked her to wear sunblock if she is out in the sun. I have asked her to take activity a yogurt or a probiotic daily while she is on the antibiotic. Patient is in agreement with the above plan, she understands she will get a call to schedule the PET scan. Langley Gauss I have placed the order for the PET scan, sent in a prescription for doxycycline, advised the patient to take probiotic daily while on antibiotic. Patient will need follow-up appointment with either [place], Icard, Dewald, or myself to review the PET scan as soon as possible after the scan has been done. Please fax results to PCP and let her know plan is for PET scan and follow-up in the office to determine best plan of care based on results. Thanks so much

## 2022-02-08 NOTE — Telephone Encounter (Signed)
Results and plan faxed to PCP ?

## 2022-02-09 ENCOUNTER — Telehealth: Payer: Self-pay | Admitting: Pulmonary Disease

## 2022-02-09 MED ORDER — BUDESONIDE-FORMOTEROL FUMARATE 160-4.5 MCG/ACT IN AERO: 2.0000 | INHALATION_SPRAY | Freq: Two times a day (BID) | RESPIRATORY_TRACT | 6 refills | Status: DC

## 2022-02-09 NOTE — Telephone Encounter (Signed)
See telephone note from 02/08/22

## 2022-02-09 NOTE — Telephone Encounter (Signed)
Called patient and she states that she is needing a new rx sent for Symbicort and it needs to be generic. I sent in new rx with a message stating fill per insurance requirements. Verified pharmacy. Nothing further needed

## 2022-02-21 ENCOUNTER — Encounter (HOSPITAL_COMMUNITY)
Admission: RE | Admit: 2022-02-21 | Discharge: 2022-02-21 | Disposition: A | Discharge status: Home / Self Care | Payer: Medicare Other | Source: Ambulatory Visit | Attending: Acute Care | Admitting: Acute Care

## 2022-02-21 DIAGNOSIS — R918 Other nonspecific abnormal finding of lung field: Secondary | ICD-10-CM | POA: Insufficient documentation

## 2022-02-21 LAB — GLUCOSE, CAPILLARY: Glucose-Capillary: 125 mg/dL — ABNORMAL HIGH (ref 70–99)

## 2022-02-21 MED ORDER — FLUDEOXYGLUCOSE F - 18 (FDG) INJECTION
5.0000 | Freq: Once | INTRAVENOUS | Status: AC
  Administered 2022-02-21: 5 via INTRAVENOUS

## 2022-02-23 ENCOUNTER — Other Ambulatory Visit: Payer: Self-pay | Admitting: Pulmonary Disease

## 2022-03-02 ENCOUNTER — Other Ambulatory Visit: Payer: Self-pay

## 2022-03-02 ENCOUNTER — Ambulatory Visit (INDEPENDENT_AMBULATORY_CARE_PROVIDER_SITE_OTHER): Payer: Medicare Other | Admitting: Acute Care

## 2022-03-02 VITALS — BP 134/70 | HR 102 | Temp 98.2°F | Ht 59.0 in | Wt 99.2 lb

## 2022-03-02 DIAGNOSIS — J439 Emphysema, unspecified: Secondary | ICD-10-CM

## 2022-03-02 DIAGNOSIS — R918 Other nonspecific abnormal finding of lung field: Secondary | ICD-10-CM | POA: Diagnosis not present

## 2022-03-02 DIAGNOSIS — Z87891 Personal history of nicotine dependence: Secondary | ICD-10-CM

## 2022-03-02 NOTE — Progress Notes (Unsigned)
History of Present Illness Amber Chaney is a 72 y.o. female former smoker with COPD . She is followed by Dr. Erin Fulling and through the Lung Cancer screening program.    03/02/2022 Pt. Presents for follow up after  abnormal lung cancer screening 02/07/2022 and subsequent  PET scan imaging  to better evaluate an abnormal finding of right lower lobe aggressive appearing lesions , the largest of which was 15.2 mm.  . Her 01/2022 LDCT was read as a LR 4B.  PET scan showed decrease in  size of pulmonary nodule, with recommendation of a 6 month follow up low dose Ct chest. There was however notation of a hypermetabolic hypermetabolism in the right parotid gland which is suspicious for a neoplasm. Radiology recommend a neck MRI with and without contrast to further evaluate. We will order the MRI in order to better evaluate the right parotid gland.    Test Results: PET scan 02/21/2022 Decreased size of right lung nodules, with residual nodules in the right lower lobe showing no FDG uptake. These findings suggest a resolving inflammatory or infectious etiology. Recommend continued follow-up by chest CT in 6 months.   Focal hypermetabolism in the posterior right parotid gland, suspicious for parotid neoplasm. Recommend neck MRI without and with contrast for further evaluation.   No evidence of metastatic disease.   Colonic diverticulosis. No radiographic evidence of diverticulitis.  Low Dose CT Chest  02/07/2022  Multiple new pulmonary nodules are noted, most concerning of which are in the right lower lobe where there are several aggressive appearing lesions, largest of which is noted on axial image 248 with a volume derived mean diameter of 15.2 mm. No acute consolidative airspace disease. No pleural effusions. Diffuse bronchial wall thickening with mild centrilobular and paraseptal emphysema. Lung-RADS 4BS, suspicious. Additional imaging evaluation or consultation with Pulmonology or Thoracic  Surgery recommended.     Latest Ref Rng & Units 11/25/2017    1:47 PM 02/05/2013    3:32 AM 02/04/2013    7:43 AM  CBC  WBC 4.0 - 10.5 K/uL 9.9  4.1  5.1   Hemoglobin 12.0 - 15.0 g/dL 13.9  12.5  12.4   Hematocrit 36.0 - 46.0 % 42.4  36.3  36.1   Platelets 150 - 400 K/uL 295  205  177        Latest Ref Rng & Units 11/25/2017    1:47 PM 02/05/2013    3:32 AM 02/04/2013    7:43 AM  BMP  Glucose 70 - 99 mg/dL 91  171  122   BUN 8 - 23 mg/dL 7  12  7   $ Creatinine 0.44 - 1.00 mg/dL 0.69  0.81  0.78   Sodium 135 - 145 mmol/L 129  131  131   Potassium 3.5 - 5.1 mmol/L 4.1  4.2  4.6   Chloride 98 - 111 mmol/L 97  95  95   CO2 22 - 32 mmol/L 23  22  21   $ Calcium 8.9 - 10.3 mg/dL 9.5  9.2  8.8     BNP No results found for: "BNP"  ProBNP    Component Value Date/Time   PROBNP 487.5 (H) 02/04/2013 0745    PFT    Component Value Date/Time   FEV1PRE 1.00 12/26/2015 0958   FEV1POST 1.15 12/26/2015 0958   FVCPRE 2.15 12/26/2015 0958   FVCPOST 2.33 12/26/2015 0958   DLCOUNC 14.82 12/26/2015 0958   PREFEV1FVCRT 46 12/26/2015 0958   PSTFEV1FVCRT 49 12/26/2015 0958  NM PET Image Initial (PI) Skull Base To Thigh  Result Date: 02/22/2022 CLINICAL DATA:  Initial treatment strategy for bilateral pulmonary nodules. EXAM: NUCLEAR MEDICINE PET SKULL BASE TO THIGH TECHNIQUE: 5.0 mCi F-18 FDG was injected intravenously. Full-ring PET imaging was performed from the skull base to thigh after the radiotracer. CT data was obtained and used for attenuation correction and anatomic localization. Fasting blood glucose: 125 mg/dl COMPARISON:  Chest CT on 02/07/2022 FINDINGS: Mediastinal blood-pool activity (background): SUV max = 2.0 Liver activity (reference): SUV max = N/A NECK: Focal area of hypermetabolism is seen in the posterior right parotid gland, with SUV max of 5.7. This is suspicious for parotid neoplasm. No hypermetabolic lymph nodes or other masses identified. Incidental CT findings:  None.  CHEST: No hypermetabolic lymph nodes. Previously seen right lung nodules appear decreased since prior study. Largest residual nodule in the posterior right lower lobe measures 1.2 x 1.1 cm on image 54/7, compared to 1.9 by 1.1 cm previously. No FDG uptake is seen. No new or enlarging pulmonary nodules seen on CT images. Incidental CT findings: Mild centrilobular emphysema. Aortic and coronary atherosclerotic calcification incidentally noted. ABDOMEN/PELVIS: No abnormal hypermetabolic activity within the liver, pancreas, adrenal glands, or spleen. No hypermetabolic lymph nodes in the abdomen or pelvis. Incidental CT findings: Diverticulosis is seen mainly involving the descending and sigmoid colon, however there is no evidence of diverticulitis. Aortic atherosclerotic calcification incidentally noted. SKELETON: No focal hypermetabolic bone lesions to suggest skeletal metastasis. Incidental CT findings:  None. IMPRESSION: Decreased size of right lung nodules, with residual nodules in the right lower lobe showing no FDG uptake. These findings suggest a resolving inflammatory or infectious etiology. Recommend continued follow-up by chest CT in 6 months. Focal hypermetabolism in the posterior right parotid gland, suspicious for parotid neoplasm. Recommend neck MRI without and with contrast for further evaluation. No evidence of metastatic disease. Colonic diverticulosis. No radiographic evidence of diverticulitis. Aortic Atherosclerosis (ICD10-I70.0) and Emphysema (ICD10-J43.9). Electronically Signed   By: Marlaine Hind M.D.   On: 02/22/2022 10:01   CT CHEST LUNG CA SCREEN LOW DOSE W/O CM  Result Date: 02/07/2022 CLINICAL DATA:  72 year old female former smoker (quit 10 years ago) with 40 pack-year history of smoking. Lung cancer screening examination. EXAM: CT CHEST WITHOUT CONTRAST LOW-DOSE FOR LUNG CANCER SCREENING TECHNIQUE: Multidetector CT imaging of the chest was performed following the standard protocol  without IV contrast. RADIATION DOSE REDUCTION: This exam was performed according to the departmental dose-optimization program which includes automated exposure control, adjustment of the mA and/or kV according to patient size and/or use of iterative reconstruction technique. COMPARISON:  Low-dose lung cancer screening chest CT 02/06/2021. FINDINGS: Cardiovascular: Heart size is normal. There is no significant pericardial fluid, thickening or pericardial calcification. There is aortic atherosclerosis, as well as atherosclerosis of the great vessels of the mediastinum and the coronary arteries, including calcified atherosclerotic plaque in the left anterior descending and right coronary arteries. Calcifications of the aortic valve. Mediastinum/Nodes: No pathologically enlarged mediastinal or hilar lymph nodes. Please note that accurate exclusion of hilar adenopathy is limited on noncontrast CT scans. Esophagus is unremarkable in appearance. No axillary lymphadenopathy. Lungs/Pleura: Multiple new pulmonary nodules are noted, most concerning of which are in the right lower lobe where there are several aggressive appearing lesions, largest of which is noted on axial image 248 with a volume derived mean diameter of 15.2 mm. No acute consolidative airspace disease. No pleural effusions. Diffuse bronchial wall thickening with mild centrilobular and paraseptal emphysema.  Upper Abdomen: Aortic atherosclerosis. Musculoskeletal: There are no aggressive appearing lytic or blastic lesions noted in the visualized portions of the skeleton. IMPRESSION: 1. Lung-RADS 4BS, suspicious. Additional imaging evaluation or consultation with Pulmonology or Thoracic Surgery recommended. 2. The "S" modifier above refers to potentially clinically significant non lung cancer related findings. Specifically, there is aortic atherosclerosis, in addition to two-vessel coronary artery disease. Please note that although the presence of coronary artery  calcium documents the presence of coronary artery disease, the severity of this disease and any potential stenosis cannot be assessed on this non-gated CT examination. Assessment for potential risk factor modification, dietary therapy or pharmacologic therapy may be warranted, if clinically indicated. 3. Mild diffuse bronchial wall thickening with mild centrilobular and paraseptal emphysema; imaging findings suggestive of underlying COPD. 4. There are calcifications of the aortic valve. Echocardiographic correlation for evaluation of potential valvular dysfunction may be warranted if clinically indicated. These results will be called to the ordering clinician or representative by the Radiologist Assistant, and communication documented in the PACS or [employer]. Aortic Atherosclerosis (ICD10-I70.0) and Emphysema (ICD10-J43.9). Electronically Signed   By: Vinnie Langton M.D.   On: 02/07/2022 13:13    Past medical hx Past Medical History:  Diagnosis Date   Allergic rhinitis    Cancer ([place])    skin cancer. cervical - pre cancerous   COPD (chronic obstructive pulmonary disease) ([facility])    Hypertension    Pneumonia     2- 3 times last time 01/2013     Social History   Tobacco Use   Smoking status: Former    Packs/day: 0.25    Years: 44.00    Total pack years: 11.00    Types: Cigarettes    Quit date: 11/15/2012    Years since quitting: 9.2   Smokeless tobacco: Never   Tobacco comments:    Started smoking at age 31.    Vaping Use   Vaping Use: Former  Substance Use Topics   Alcohol use: Yes    Alcohol/week: 14.0 standard drinks of alcohol    Types: 14 Cans of beer per week   Drug use: No    Ms.Greenlaw reports that she quit smoking about 9 years ago. Her smoking use included cigarettes. She has a 11.00 pack-year smoking history. She has never used smokeless tobacco. She reports current alcohol use of about 14.0 standard drinks of alcohol per week. She reports that she does not use  drugs.  Tobacco Cessation: Counseling given: Not Answered Tobacco comments: Started smoking at age 37.   Former smoker , Quit 2014 with a 32 pack year smoking history  Past surgical hx, Family hx, Social hx all reviewed.  Current Outpatient Medications on File Prior to Visit  Medication Sig   albuterol (PROVENTIL) (2.5 MG/3ML) 0.083% nebulizer solution USE 1 VIAL VIA NEBULIZER EVERY 6 HOURS AS NEEDED   albuterol (VENTOLIN HFA) 108 (90 Base) MCG/ACT inhaler INHALE 1 TO 2 PUFFS INTO THE LUNGS EVERY 6 HOURS AS NEEDED FOR WHEEZING OR SHORTNESS OF BREATH   amLODipine (NORVASC) 5 MG tablet Take 5 mg by mouth at bedtime.    budesonide-formoterol (SYMBICORT) 160-4.5 MCG/ACT inhaler Inhale 2 puffs into the lungs 2 (two) times daily.   flunisolide (NASALIDE) 25 MCG/ACT (0.025%) SOLN USE 1 SPRAY IN EACH NOSTRIL TWICE DAILY AS NEEDED FOR ALLERGIES   loratadine (CLARITIN) 10 MG tablet Take 10 mg by mouth daily as needed for allergies.    losartan (COZAAR) 50 MG tablet Take 1 tablet (50  mg total) by mouth daily.   SPIRIVA HANDIHALER 18 MCG inhalation capsule INHALE THE CONTENTS OF 1 CAPSULE VIA INHALATION DEVICE EVERY DAY   SYMBICORT 160-4.5 MCG/ACT inhaler INHALE 2 PUFFS INTO THE LUNGS TWICE DAILY   Vitamin D, Ergocalciferol, (DRISDOL) 50000 UNITS CAPS Take 50,000 Units by mouth every Monday.   atorvastatin (LIPITOR) 10 MG tablet Take 10 mg by mouth daily.   doxycycline (VIBRA-TABS) 100 MG tablet Take 1 tablet (100 mg total) by mouth 2 (two) times daily.   No current facility-administered medications on file prior to visit.     Allergies  Allergen Reactions   A-G Pro Other (See Comments)    Shots cause tumors in arms    Review Of Systems:  Constitutional:   No  weight loss, night sweats,  Fevers, chills, fatigue, or  lassitude.  HEENT:   No headaches,  Difficulty swallowing,  Tooth/dental problems, or  Sore throat,                No sneezing, itching, ear ache, nasal congestion, post nasal  drip,   CV:  No chest pain,  Orthopnea, PND, swelling in lower extremities, anasarca, dizziness, palpitations, syncope.   GI  No heartburn, indigestion, abdominal pain, nausea, vomiting, diarrhea, change in bowel habits, loss of appetite, bloody stools.   Resp: No shortness of breath with exertion or at rest.  No excess mucus, no productive cough,  No non-productive cough,  No coughing up of blood.  No change in color of mucus.  No wheezing.  No chest wall deformity  Skin: no rash or lesions.  GU: no dysuria, change in color of urine, no urgency or frequency.  No flank pain, no hematuria   MS:  No joint pain or swelling.  No decreased range of motion.  No back pain.  Psych:  No change in mood or affect. No depression or anxiety.  No memory loss.   Vital Signs BP 134/70   Pulse (!) 102   Temp 98.2 F (36.8 C) (Oral)   Ht 4' 11"$  (1.499 m)   Wt 99 lb 3.2 oz (45 kg)   SpO2 98%   BMI 20.04 kg/m    Physical Exam:  General- No distress,  A&Ox3,  appropriate ENT: No sinus tenderness, TM clear, pale nasal mucosa, no oral exudate,no post nasal drip, no LAN Cardiac: S1, S2, regular rate and rhythm, no murmur Chest: No wheeze/ rales/ dullness; no accessory muscle use, no nasal flaring, no sternal retractions Abd.: Soft Non-tender, ND, BS +,Body mass index is 20.04 kg/m.  Ext: No clubbing cyanosis, edema Neuro:  normal strength, MAE x 4, A&O x 3, Appropriate Skin: No rashes, warm and dry, no lesions  Psych: normal mood and behavior   Assessment/Plan Multiple new aggressive appearing pulmonary nodules per RLL on low dose CT Chest screening On PET imaging these nodules have decreased in  size showing no FDG uptake  suggest a resolving inflammatory or infectious etiology. Plan Low Dose CT Chest in 6 months as follow up   Incidental finding of Focal hypermetabolism in the posterior right parotid gland, suspicious for parotid neoplasm, Plan Will call patient and let her know  recommendation is for MRI with and without contrast for further evaluation. Follow up after PET scan of the neck with Dr. Erin Fulling or Judson Roch NP  COPD Stable Interval Plan Continue Symbicort and Spiriva as you have been doing.  Continue Albuterol as needed for shortness of breath or wheezing. Follow up with Dr. Erin Fulling  in March 2024.  Please contact office for sooner follow up if symptoms do not improve or worsen or seek emergency care     I spent 30 minutes dedicated to the care of this patient on the date of this encounter to include pre-visit review of records, face-to-face time with the patient discussing conditions above, post visit ordering of testing, clinical documentation with the electronic health record, making appropriate referrals as documented, and communicating necessary information to the patient's healthcare team.   Magdalen Spatz, NP 03/02/2022  10:14 AM

## 2022-03-02 NOTE — Patient Instructions (Addendum)
It is good to see you today. Your PET scan shows a Decreased size of right lung nodules, with residual nodules in the right lower lobe showing no FDG uptake. These findings suggest a resolving inflammatory or infectious etiology.  We will do  continued follow-up Low Dose CT Chest in 6 months. You will get a call to get this scheduled Continue Symbicort and Spiriva as you have been doing.  Continue Albuterol as needed for shortness of breath or wheezing. There was notation of a  Call if you need [place].  Follow up with Dr. Erin Fulling in March 2024.  Please contact office for sooner follow up if symptoms do not improve or worsen or seek emergency care

## 2022-03-03 ENCOUNTER — Encounter: Payer: Self-pay | Admitting: Acute Care

## 2022-03-03 ENCOUNTER — Telehealth: Payer: Self-pay | Admitting: Acute Care

## 2022-03-03 NOTE — Telephone Encounter (Signed)
I have called the patient to discuss the finding of the hypermetabolism of the right parotid gland. I explained tat there is recommendation by radiology for an MRI  neck with and without contrast to better evaluate.  She is very claustrophobic and does not feel she can do an MRI.  I will reach out to radiology and see if they feel we could get what we need with a CT soft tissues neck.  I have told Amber Chaney I will call radiology Monday, and then call her and let hher know the plan.  She verbalized understanding.

## 2022-03-05 ENCOUNTER — Other Ambulatory Visit: Payer: Self-pay | Admitting: Acute Care

## 2022-03-05 ENCOUNTER — Telehealth: Payer: Self-pay | Admitting: Acute Care

## 2022-03-05 DIAGNOSIS — K111 Hypertrophy of salivary gland: Secondary | ICD-10-CM

## 2022-03-05 NOTE — Telephone Encounter (Signed)
See telephone note 03/05/22

## 2022-03-05 NOTE — Telephone Encounter (Signed)
I have called the patient . I spoke with Dr. Kris Hartmann , who read the PET scan which had an incidental finding of Focal hypermetabolism in the posterior right parotid gland, suspicious for parotid neoplasm. The patient states she cannot do the MRI scan as she is too claustrophobic. I spoke with Dr. Kris Hartmann, and he said a CT Neck soft tissue with and without would be adequate to evaluate the parotid gland. I did explained to the patient that the MRI recommended was the better study, but that  the CT neck was the next best option. She understands that the MRI is the better study. She states she could not do the MRI, and will do the CT. The CT neck soft tissues will be ordered , with a BMET, and patient understands she will get a call to schedule.

## 2022-03-06 ENCOUNTER — Telehealth: Payer: Self-pay | Admitting: Acute Care

## 2022-03-06 ENCOUNTER — Other Ambulatory Visit: Payer: Self-pay | Admitting: Acute Care

## 2022-03-06 DIAGNOSIS — R609 Edema, unspecified: Secondary | ICD-10-CM

## 2022-03-06 NOTE — Telephone Encounter (Signed)
Can we see why this patient has not been called to schedule her CT nesk. Ordered yesterday for [facility]. She has not received a call. I have placed a referral to ENT, and they need to see her after the scan is done, so we need it scheduled so she can get in asap after the scan is done. Thanks so much

## 2022-03-06 NOTE — Telephone Encounter (Signed)
I would go ahead and place referral so that way they can see her soon after the CT is done.  Thanks, JD

## 2022-03-09 ENCOUNTER — Ambulatory Visit (HOSPITAL_BASED_OUTPATIENT_CLINIC_OR_DEPARTMENT_OTHER)
Admission: RE | Admit: 2022-03-09 | Discharge: 2022-03-09 | Disposition: A | Discharge status: Home / Self Care | Payer: Medicare Other | Source: Ambulatory Visit | Attending: Acute Care | Admitting: Acute Care

## 2022-03-09 DIAGNOSIS — K111 Hypertrophy of salivary gland: Secondary | ICD-10-CM | POA: Insufficient documentation

## 2022-03-09 MED ORDER — IOHEXOL 300 MG/ML  SOLN
75.0000 mL | Freq: Once | INTRAMUSCULAR | Status: AC | PRN
  Administered 2022-03-09: 75 mL via INTRAVENOUS

## 2022-03-12 ENCOUNTER — Other Ambulatory Visit: Payer: Self-pay | Admitting: *Deleted

## 2022-03-12 DIAGNOSIS — J432 Centrilobular emphysema: Secondary | ICD-10-CM

## 2022-03-12 MED ORDER — TIOTROPIUM BROMIDE MONOHYDRATE 18 MCG IN CAPS: 18.0000 ug | ORAL_CAPSULE | Freq: Every day | RESPIRATORY_TRACT | 12 refills | Status: DC

## 2022-03-22 ENCOUNTER — Encounter: Payer: Self-pay | Admitting: Pulmonary Disease

## 2022-03-22 ENCOUNTER — Ambulatory Visit (INDEPENDENT_AMBULATORY_CARE_PROVIDER_SITE_OTHER): Payer: Medicare Other | Admitting: Pulmonary Disease

## 2022-03-22 VITALS — BP 134/64 | HR 90 | Ht 59.0 in | Wt 98.2 lb

## 2022-03-22 DIAGNOSIS — J432 Centrilobular emphysema: Secondary | ICD-10-CM

## 2022-03-22 NOTE — Patient Instructions (Signed)
Continue Symbicort 2 puffs twice daily  Continue Spiriva 2 puffs daily  Continue to use albuterol inhaler as needed  Follow up in 1 year

## 2022-03-22 NOTE — Progress Notes (Signed)
Synopsis: Referred for COPD by Bartholome Bill, MD.  Previously a patient of Dr. Lenna Gilford and Dr. Carlis Abbott.  Subjective:   PATIENT ID: Amber Chaney GENDER: female DOB: 01/31/1950, MRN: [id number]  HPI  Chief Complaint  Patient presents with   Follow-up    72yrf/u. States she has been doing well since last visit.    Amber Chaney a 72year old woman, former smoker with COPD who returns to pulmonary clinic for follow up.   She has been doing well since last follow up. She is using symbicort twice daily and spiriva daily. She has morning chest congestion. She is not using albuterol much. She is seeing ENT, Dr. 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  OV 04/04/21 She continues on Spiriva daily and Symbicort twice daily. No need for prednisone in the last year.   She is having cough and mucous production in the mornings. She reports her spring allergies are picking up but usually well controlled with OTC medications.  OV 04/04/20 She has been doing well since last visit with Dr. CCarlis Abbott She reports no changes in her respiratory symptoms. She has congestion and cough in the morning which clears and is not an issue the remainder of the day.   She continues on Spiriva daily and Symbicort twice daily. She is enrolled in our lung cancer screening program and last scan was 12/08/19 with no changes in the tiny nodules reported on previous scans.   OV 11/05/19: Amber Chaney 72year old woman with a history of COPD who presents for follow-up.  She continues on daily Spiriva and twice daily Symbicort.  She feels like her medication regimen is optimized.  She very infrequently has exacerbations.  She brought albuterol nebulizers that she has not used, both which expired 2 to 3 years ago.  Her only side effect from her medications is occasionally having dry mouth from Spiriva.  She denies wheezing, sputum production change.  She has occasional cough in the morning due to  nasal congestion that she has overnight, which always clears by midday.  She has dyspnea on exertion when walking up hills, but can walk more than a mile on flat ground.  She walks more than a mile about 3 to 4 days/week for exercise.  She is up-to-date on flu shot (gets low-dose after previously having a bad reaction to high-dose), Covid, pneumonia shots.  She has not previously tried taking any medication for her nasal congestion.  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   Past Medical History:  Diagnosis Date   Allergic rhinitis    Cancer ([place]    skin cancer. 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    Vaping Use   Vaping Use: Former  Substance and Sexual Activity   Alcohol use: Yes    Alcohol/week: 14.0 standard drinks of alcohol    Types: 14 Cans of beer per week   Drug use: No   Sexual activity: Yes  Other Topics Concern   Not on file  Social History Narrative   Not on file   Social Determinants of Health   Financial Resource Strain: Not on file  Food Insecurity: Not on file  Transportation Needs: Not on file  Physical Activity: Not on file  Stress: Not on file  Social Connections: Not on file  Intimate Partner Violence: Not on  file     Allergies  Allergen Reactions   A-G Pro Other (See Comments)    Shots cause tumors in arms     Outpatient Medications Prior to Visit  Medication Sig Dispense Refill   albuterol (PROVENTIL) (2.5 MG/3ML) 0.083% nebulizer solution USE 1 VIAL VIA NEBULIZER EVERY 6 HOURS AS NEEDED 75 mL 3   albuterol (VENTOLIN HFA) 108 (90 Base) MCG/ACT inhaler INHALE 1 TO 2 PUFFS INTO THE LUNGS EVERY 6 HOURS AS NEEDED FOR WHEEZING OR SHORTNESS OF BREATH 8.5 g 5   amLODipine (NORVASC) 5 MG tablet Take 5 mg by mouth at bedtime.      atorvastatin (LIPITOR) 10 MG tablet Take 10 mg by mouth daily.     budesonide-formoterol (SYMBICORT) 160-4.5 MCG/ACT inhaler Inhale 2 puffs into the lungs 2 (two) times daily. 1 each 6   flunisolide (NASALIDE) 25 MCG/ACT (0.025%) SOLN USE 1 SPRAY IN EACH NOSTRIL TWICE DAILY AS NEEDED FOR ALLERGIES 25 mL 5   loratadine (CLARITIN) 10 MG tablet Take 10 mg by mouth daily as needed for allergies.      losartan (COZAAR) 50 MG tablet Take 1 tablet (50 mg total) by mouth daily. 90 tablet 3   tiotropium (SPIRIVA HANDIHALER) 18 MCG inhalation capsule Place 1 capsule (18 mcg total) into inhaler and inhale daily. 30 capsule 12   Vitamin D, Ergocalciferol, (DRISDOL) 50000 UNITS CAPS Take 50,000 Units by mouth every Monday.     doxycycline (VIBRA-TABS) 100 MG tablet Take 1 tablet (100 mg total) by mouth 2 (two) times daily. 14 tablet 0   SYMBICORT 160-4.5 MCG/ACT inhaler INHALE 2 PUFFS INTO THE LUNGS TWICE DAILY 10.2 g 11   No facility-administered medications prior to visit.    Review of Systems  Constitutional:  Negative for chills, fever, malaise/fatigue and weight loss.  HENT:  Negative for congestion, sinus pain and sore throat.   Eyes: Negative.   Respiratory:  Positive for cough and sputum production (in AM). Negative for hemoptysis, shortness of breath and wheezing.   Cardiovascular:  Negative for chest pain, palpitations, orthopnea, claudication and leg swelling.   Gastrointestinal:  Negative for abdominal pain, heartburn, nausea and vomiting.  Genitourinary: Negative.   Musculoskeletal:  Negative for joint pain and myalgias.  Skin:  Negative for rash.  Neurological:  Negative for weakness.  Endo/Heme/Allergies: Negative.   Psychiatric/Behavioral: Negative.      Objective:   Vitals:   03/22/22 1032  BP: 134/64  Pulse: 90  SpO2: 97%  Weight: 98 lb 3.2 oz (44.5 kg)  Height: '4\' 11"'$  (1.499 m)   Physical Exam Constitutional:      General:  She is not in acute distress.    Appearance: She is not ill-appearing.  HENT:     Head: Normocephalic and atraumatic.     Nose: Nose normal.     Mouth/Throat:     Mouth: Mucous membranes are moist.     Pharynx: Oropharynx is clear.  Eyes:     General: No scleral icterus.    Conjunctiva/sclera: Conjunctivae normal.  Cardiovascular:     Rate and Rhythm: Normal rate and regular rhythm.     Pulses: Normal pulses.     Heart sounds: Normal heart sounds. No murmur heard. Pulmonary:     Effort: Pulmonary effort is normal.     Breath sounds: Normal breath sounds. No wheezing, rhonchi or rales.  Musculoskeletal:     Right lower leg: No edema.     Left lower leg: No edema.  Skin:    General: Skin is warm and dry.  Neurological:     General: No focal deficit present.     Mental Status: She is alert.     CBC    Component Value Date/Time   WBC 9.9 11/25/2017 1347   RBC 4.15 11/25/2017 1347   HGB 13.9 11/25/2017 1347   HCT 42.4 11/25/2017 1347   PLT 295 11/25/2017 1347   MCV 102.2 (H) 11/25/2017 1347   MCH 33.5 11/25/2017 1347   MCHC 32.8 11/25/2017 1347   RDW 11.9 11/25/2017 1347   LYMPHSABS 0.5 (L) 02/04/2013 0743   MONOABS 0.9 02/04/2013 0743   EOSABS 0.0 02/04/2013 0743   BASOSABS 0.0 02/04/2013 0743      Latest Ref Rng & Units 11/25/2017    1:47 PM 02/05/2013    3:32 AM 02/04/2013    7:43 AM  BMP  Glucose 70 - 99 mg/dL 91  171  122   BUN 8 - 23 mg/dL '7  12  7   '$ Creatinine 0.44 - 1.00  mg/dL 0.69  0.81  0.78   Sodium 135 - 145 mmol/L 129  131  131   Potassium 3.5 - 5.1 mmol/L 4.1  4.2  4.6   Chloride 98 - 111 mmol/L 97  95  95   CO2 22 - 32 mmol/L '23  22  21   '$ Calcium 8.9 - 10.3 mg/dL 9.5  9.2  8.8    Chest imaging: CT Chest LCS 02/06/21 Mediastinum/Nodes: No pathologically enlarged lymph nodes seen in the chest. Mildly patulous esophagus.   Lungs/Pleura: Central airways are patent. Scattered areas of bronchial wall thickening and mucous plugging seen primarily in the lower lungs, likely due to aspiration. No consolidation, pleural effusion or pneumothorax. Stable small solid pulmonary nodules measuring less than 6 mm.  CXR 12/08/19: reviewed centrilobular and paraseptal emphysema and biapical pleuroparenchymal scarring noted. Tiny pulmonary nodules are stable.  PFT:    Latest Ref Rng & Units 12/26/2015    9:58 AM  PFT Results  FVC-Pre L 2.15  P  FVC-Predicted Pre % 78  P  FVC-Post L 2.33  P  FVC-Predicted Post % 85  P  Pre FEV1/FVC % % 46  P  Post FEV1/FCV % % 49  P  FEV1-Pre L 1.00  P  FEV1-Predicted Pre % 47  P  FEV1-Post L 1.15  P  DLCO uncorrected ml/min/mmHg 14.82  P  DLCO UNC% % 75  P  DLCO corrected ml/min/mmHg 14.64  P  DLCO COR %Predicted % 74  P  DLVA Predicted % 75  P    P Preliminary result  2017 -  Severe obstruction with significant bronchodilator reversibility.  Mild diffusion limitation.  Normal lung volumes.  Flow volume loop consistent with obstruction.     Assessment & Plan:   Centrilobular emphysema ([place])  Discussion: Narely Degraw is a 72 year old woman, former smoker with COPD who returns to pulmonary clinic for follow up.   She continues to do well on Spiriva daily and Symbicort twice daily.   She is to continue follow up in our lung cancer screening program.  She is following with ENT for her parotid gland finding on PET scan.  Follow up in 1 year. Follow up sooner if needed.  Freda Jackson, MD [facility] Office: [phone]   Current Outpatient Medications:    albuterol (PROVENTIL) (2.5 MG/3ML) 0.083% nebulizer solution, USE 1 VIAL VIA NEBULIZER EVERY 6 HOURS AS NEEDED, Disp: 75 mL, Rfl: 3   albuterol (VENTOLIN HFA) 108 (90 Base) MCG/ACT inhaler, INHALE 1 TO 2 PUFFS INTO THE LUNGS EVERY 6 HOURS AS NEEDED FOR WHEEZING OR SHORTNESS OF BREATH, Disp: 8.5 g, Rfl: 5   amLODipine (NORVASC) 5 MG tablet, Take 5 mg by mouth at bedtime. , Disp: , Rfl:    atorvastatin (LIPITOR) 10 MG tablet, Take 10 mg by mouth daily., Disp: , Rfl:    budesonide-formoterol (SYMBICORT) 160-4.5 MCG/ACT inhaler, Inhale 2 puffs into the lungs 2 (two) times daily., Disp: 1 each, Rfl: 6   flunisolide (NASALIDE) 25 MCG/ACT (0.025%) SOLN, USE 1 SPRAY IN EACH NOSTRIL TWICE DAILY AS NEEDED FOR ALLERGIES, Disp: 25 mL, Rfl: 5   loratadine (CLARITIN) 10 MG tablet, Take 10 mg by mouth daily as needed for allergies. , Disp: , Rfl:    losartan (COZAAR) 50 MG tablet, Take 1 tablet (50 mg total) by mouth daily., Disp: 90 tablet, Rfl: 3   tiotropium (SPIRIVA HANDIHALER) 18 MCG inhalation capsule, Place 1 capsule (18 mcg total) into inhaler and inhale daily., Disp: 30 capsule, Rfl: 12   Vitamin D, Ergocalciferol, (DRISDOL) 50000 UNITS CAPS, Take 50,000 Units by mouth every Monday., Disp: , Rfl:

## 2022-03-23 ENCOUNTER — Ambulatory Visit: Payer: Medicare Other | Admitting: Pulmonary Disease

## 2022-03-27 ENCOUNTER — Encounter: Payer: Self-pay | Admitting: Pulmonary Disease

## 2022-04-06 ENCOUNTER — Other Ambulatory Visit: Payer: Self-pay | Admitting: Adult Health

## 2022-04-06 DIAGNOSIS — J432 Centrilobular emphysema: Secondary | ICD-10-CM

## 2022-08-22 ENCOUNTER — Ambulatory Visit (HOSPITAL_BASED_OUTPATIENT_CLINIC_OR_DEPARTMENT_OTHER)
Admission: RE | Admit: 2022-08-22 | Discharge: 2022-08-22 | Disposition: A | Discharge status: Home / Self Care | Payer: Medicare Other | Source: Ambulatory Visit | Attending: Acute Care | Admitting: Acute Care

## 2022-08-22 DIAGNOSIS — R918 Other nonspecific abnormal finding of lung field: Secondary | ICD-10-CM | POA: Insufficient documentation

## 2022-08-22 DIAGNOSIS — Z87891 Personal history of nicotine dependence: Secondary | ICD-10-CM | POA: Insufficient documentation

## 2022-08-29 ENCOUNTER — Other Ambulatory Visit: Payer: Self-pay | Admitting: Acute Care

## 2022-08-29 DIAGNOSIS — Z122 Encounter for screening for malignant neoplasm of respiratory organs: Secondary | ICD-10-CM

## 2022-08-29 DIAGNOSIS — Z87891 Personal history of nicotine dependence: Secondary | ICD-10-CM

## 2022-08-30 ENCOUNTER — Telehealth: Payer: Self-pay | Admitting: Acute Care

## 2022-08-30 NOTE — Telephone Encounter (Signed)
I have called the patient with the results of her low-dose screening CT.  Her scan was read as a lung RADS 2, the PACS views were reviewed by myself and Dr. Tonia Brooms, and plan is for 3-month follow-up which will be due in August 2025. I did speak with the patient about the notation of coronary artery disease.  She is on statin therapy however feels the dose is not adequate as her numbers have not decreased to the degree that she would prefer.  She has follow-up with her primary care doctor next month and she is going to discuss increasing dose or changing to a different statin. Patient is in agreement with 30-month follow-up low-dose screening CT. Sherre Lain, and [place], please fax results to PCP and let them know that plan will be for a 59-month follow-up due 8 of 2025. Thank you so much.

## 2022-08-30 NOTE — Telephone Encounter (Signed)
CT results were faxed to PCP and order placed for 12 month lung screening CT.

## 2022-09-03 IMAGING — CT CT CHEST LUNG CANCER SCREENING LOW DOSE W/O CM
1 series · 10 of 10 positions shown, 13 images · non-contrast
Comparison: Multiple priors, most recent dated December 08, 2019

CLINICAL DATA: Former smoker with 32 pack-year history



[ct lung segmentation data · axial · 0.69mm/px · z∈[-344,-344]mm · 10 of 309 frames shown]
[frame 1/309  mediastinal]
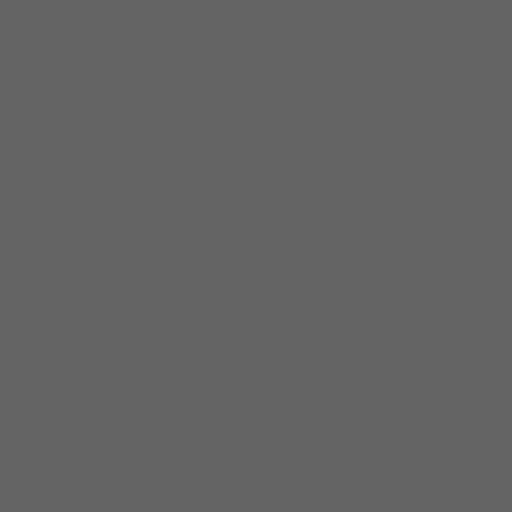
[frame 1/309  lung]
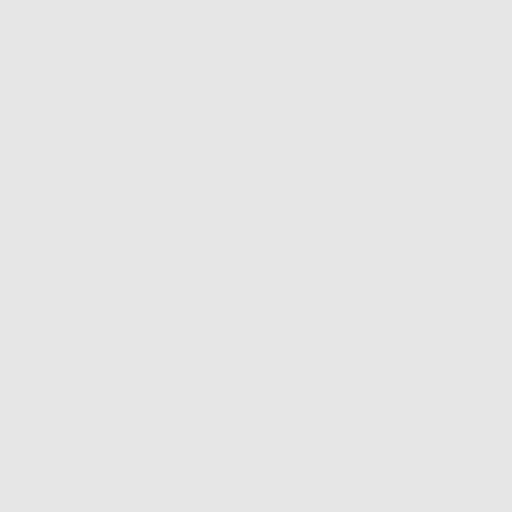
[frame 35/309  lung]
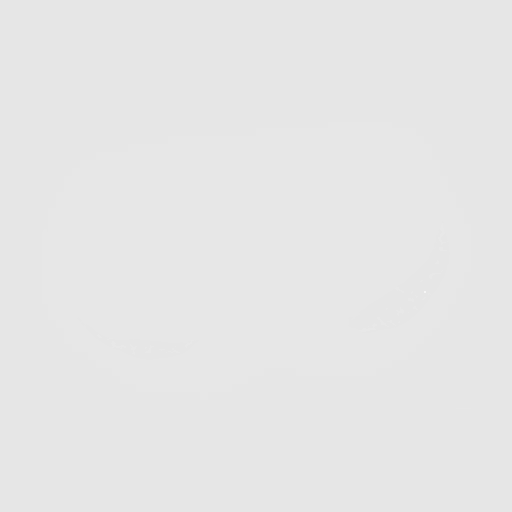
[frame 69/309  lung]
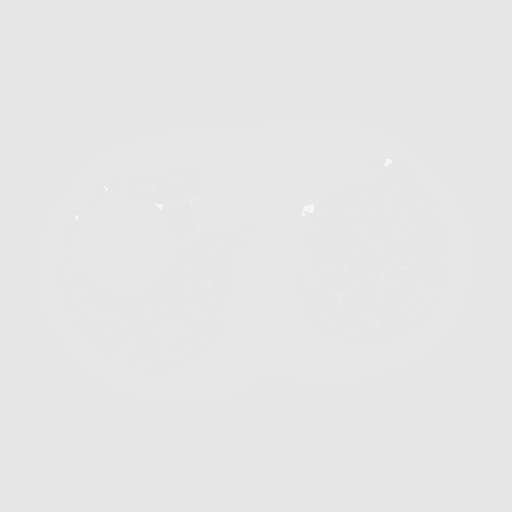
[frame 103/309  lung]
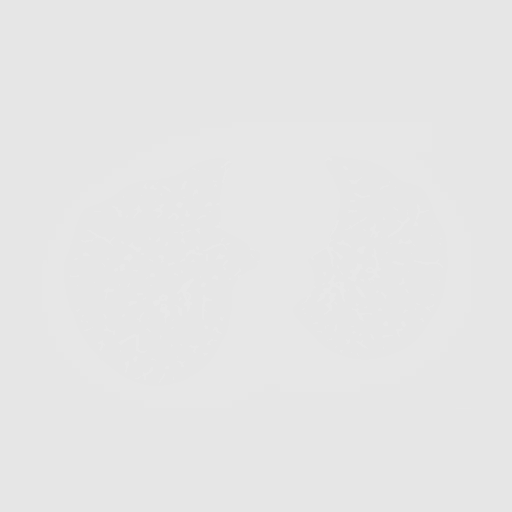
[frame 137/309  mediastinal]
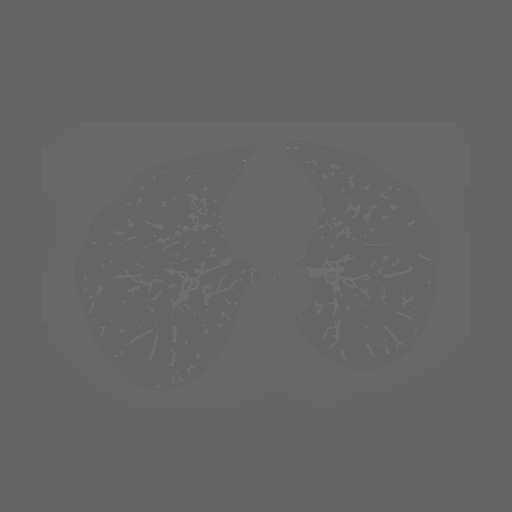
[frame 137/309  lung]
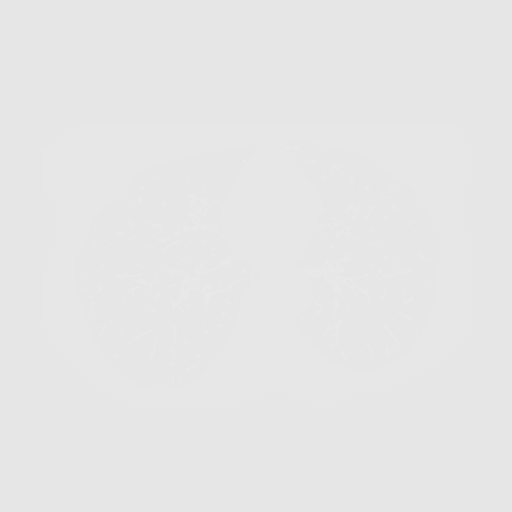
[frame 172/309  lung]
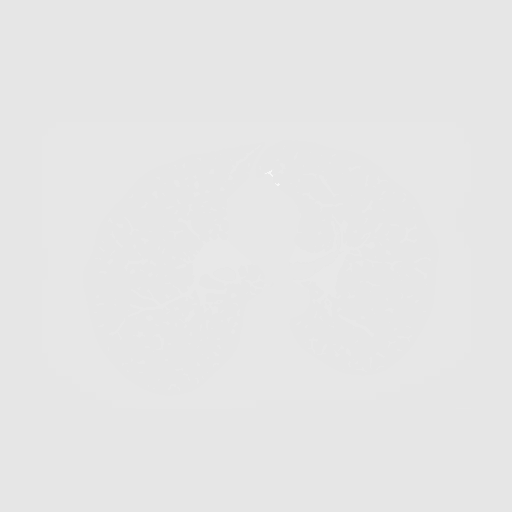
[frame 206/309  lung]
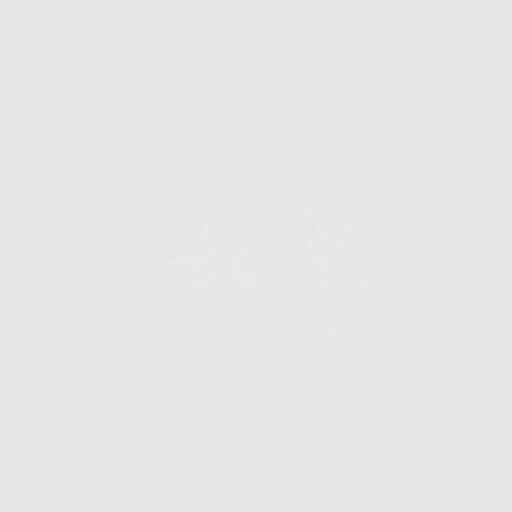
[frame 240/309  lung]
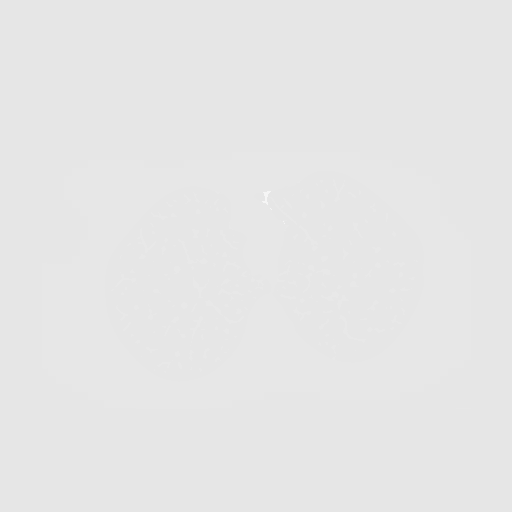
[frame 274/309  mediastinal]
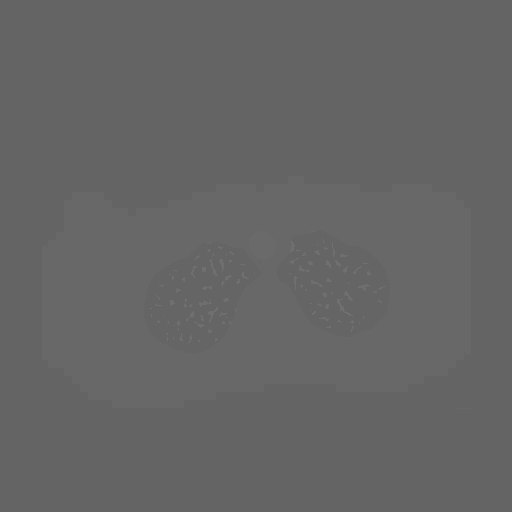
[frame 274/309  lung]
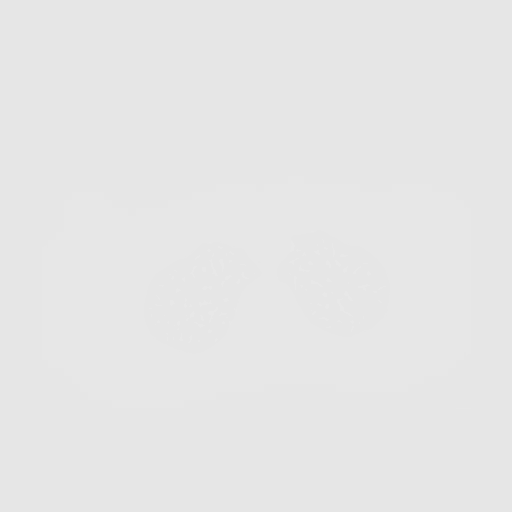
[frame 309/309  lung]
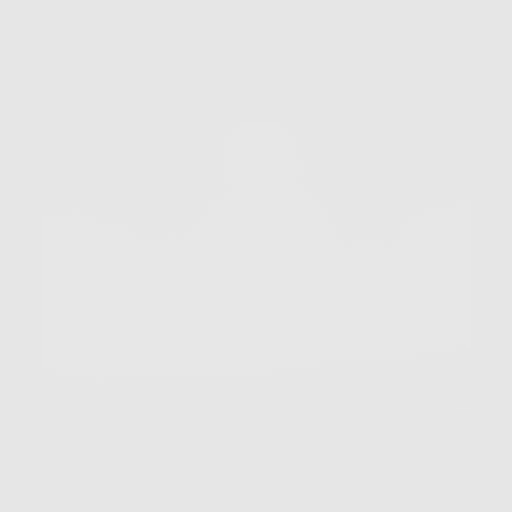

[10 of 10 positions shown; findings below may reference images not displayed]

FINDINGS: Cardiovascular: Normal heart size. No pericardial effusion. Coronary
artery calcifications of the LAD and RCA. Atherosclerotic disease of
the thoracic aorta.

Mediastinum/Nodes: No pathologically enlarged lymph nodes seen in
the chest. Mildly patulous esophagus.

Lungs/Pleura: Central airways are patent. Scattered areas of
bronchial wall thickening and mucous plugging seen primarily in the
lower lungs, likely due to aspiration. No consolidation, pleural
effusion or pneumothorax. Stable small solid pulmonary nodules
measuring less than 6 mm.

Upper Abdomen: No acute abnormality.

Musculoskeletal: Bilateral breast implants. No aggressive appearing
osseous lesions.
IMPRESSION: 1. Lung-RADS 2, benign appearance or behavior. Continue annual
screening with low-dose chest CT without contrast in 12 months.
2. Coronary artery calcifications.
3. Aortic Atherosclerosis (B3P97-VWX.X) and Emphysema ([id number].F).

## 2022-09-11 ENCOUNTER — Other Ambulatory Visit: Payer: Self-pay | Admitting: Pulmonary Disease

## 2022-11-24 LAB — COLOGUARD: COLOGUARD: POSITIVE — AB

## 2023-01-05 ENCOUNTER — Other Ambulatory Visit: Payer: Self-pay | Admitting: Pulmonary Disease

## 2023-01-31 ENCOUNTER — Other Ambulatory Visit: Payer: Self-pay | Admitting: Pulmonary Disease

## 2023-02-01 ENCOUNTER — Telehealth: Payer: Self-pay | Admitting: Pulmonary Disease

## 2023-02-01 DIAGNOSIS — J209 Acute bronchitis, unspecified: Secondary | ICD-10-CM

## 2023-02-01 MED ORDER — ALBUTEROL SULFATE HFA 108 (90 BASE) MCG/ACT IN AERS: 1.0000 | INHALATION_SPRAY | RESPIRATORY_TRACT | 5 refills | Status: AC | PRN

## 2023-02-01 MED ORDER — AMOXICILLIN-POT CLAVULANATE 875-125 MG PO TABS: 1.0000 | ORAL_TABLET | Freq: Two times a day (BID) | ORAL | 0 refills | Status: DC

## 2023-02-01 MED ORDER — ALBUTEROL SULFATE (2.5 MG/3ML) 0.083% IN NEBU: INHALATION_SOLUTION | RESPIRATORY_TRACT | 3 refills | Status: DC

## 2023-02-01 MED ORDER — PREDNISONE 10 MG PO TABS: ORAL_TABLET | ORAL | 0 refills | Status: AC

## 2023-02-01 NOTE — Telephone Encounter (Signed)
After Hours Clinic Call  [facility] to urgent care recently. Treated with Zpak and steroid shot. She is having thick dark green sputum. She is having dyspnea.   Will treat for COPD exacerbation vs bronchtiis.  Start augmentin 1 tab twice daily for 7 days Extended steroid taper sent Refilled albuterol nebs and rescue inhaler  Melody Comas, MD [facility] Office: [phone]   See Amion for personal pager PCCM on call pager [phone] until 7pm. Please call Elink 7p-7a. [phone]

## 2023-03-14 ENCOUNTER — Other Ambulatory Visit: Payer: Self-pay | Admitting: Adult Health

## 2023-03-14 DIAGNOSIS — J432 Centrilobular emphysema: Secondary | ICD-10-CM

## 2023-04-10 ENCOUNTER — Ambulatory Visit: Payer: Medicare Other | Admitting: Pulmonary Disease

## 2023-04-10 ENCOUNTER — Encounter: Payer: Self-pay | Admitting: Pulmonary Disease

## 2023-04-10 VITALS — BP 120/58 | HR 100 | Ht 59.5 in | Wt 93.4 lb

## 2023-04-10 DIAGNOSIS — J302 Other seasonal allergic rhinitis: Secondary | ICD-10-CM | POA: Diagnosis not present

## 2023-04-10 DIAGNOSIS — J432 Centrilobular emphysema: Secondary | ICD-10-CM

## 2023-04-10 MED ORDER — MONTELUKAST SODIUM 10 MG PO TABS: 10.0000 mg | ORAL_TABLET | Freq: Every day | ORAL | 11 refills | Status: AC

## 2023-04-10 NOTE — Progress Notes (Signed)
 Synopsis: Referred for COPD by Verlon Au, MD.  Previously a patient of Dr. Kriste Basque and Dr. Chestine Spore.  Subjective:   PATIENT ID: Amber Chaney GENDER: female DOB: 02-20-1950, MRN: [id number]  HPI  Chief Complaint  Patient presents with   Follow-up   Amber Chaney is a 73 year old woman, former smoker with COPD who returns to pulmonary clinic for follow up.   She has been experiencing an exacerbation of her seasonal allergies over the past week, characterized by stuffy sinuses, watery eyes, coughing, and sneezing.   Her current medication regimen includes Symbicort, two puffs twice daily, and Spiriva once daily. She uses albuterol via a nebulizer as needed. She also takes loratadine (Claritin) for allergy management.  OV 03/22/22 She has been doing well since last follow up. She is using symbicort twice daily and spiriva daily. She has morning chest congestion. She is not using albuterol much. She is seeing ENT, Dr. 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  OV 04/04/21 She continues on Spiriva daily and Symbicort twice daily. No need for prednisone in the last year.   She is having cough and mucous production in the mornings. She reports her spring allergies are picking up but usually well controlled with OTC medications.  OV 04/04/20 She has been doing well since last visit with Dr. Chestine Spore. She reports no changes in her respiratory symptoms. She has congestion and cough in the morning which clears and is not an issue the remainder of the day.   She continues on Spiriva daily and Symbicort twice daily. She is enrolled in our lung cancer screening program and last scan was 12/08/19 with no changes in the tiny nodules reported on previous scans.   OV 11/05/19: [name] a 73 year old woman with a history of COPD who presents for follow-up.  She continues on daily Spiriva and twice daily Symbicort.  She feels like her medication regimen is optimized.  She  very infrequently has exacerbations.  She brought albuterol nebulizers that she has not used, both which expired 2 to 3 years ago.  Her only side effect from her medications is occasionally having dry mouth from Spiriva.  She denies wheezing, sputum production change.  She has occasional cough in the morning due to nasal congestion that she has overnight, which always clears by midday.  She has dyspnea on exertion when walking up hills, but can walk more than a mile on flat ground.  She walks more than a mile about 3 to 4 days/week for exercise.  She is up-to-date on flu shot (gets low-dose after previously having a bad reaction to high-dose), Covid, pneumonia shots.  She has not previously tried taking any medication for her nasal congestion.  Retired: [employer], operational. Retired in 2015.   Past Medical History:  Diagnosis Date   Allergic rhinitis    Cancer (HCC)    skin cancer. 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    Vaping Use   Vaping status: Former  Substance and Sexual Activity   Alcohol use: Yes    Alcohol/week: 14.0 standard drinks of alcohol    Types: 14 Cans of beer per week   Drug  use: No   Sexual activity: Yes  Other Topics Concern   Not on file  Social History Narrative   Not on file   Social Drivers of Health   Financial Resource Strain: Not on file  Food Insecurity: Low Risk  (11/01/2022)   Received from [facility]   Hunger Vital Sign    Worried About Running Out of Food in the Last Year: Never true    Ran Out of Food in the Last Year: Never true  Transportation Needs: No Transportation Needs (11/01/2022)   Received from [facility]    In the past 12 months, has lack of reliable transportation kept you from medical appointments, meetings, work or from getting things needed for daily living? : No  Physical Activity: Not on file  Stress: Not on file  Social Connections: Not on file  Intimate Partner Violence: Not on file     Allergies  Allergen Reactions   A-G Pro Other (See Comments)    Shots cause tumors in arms     Outpatient Medications Prior to Visit  Medication Sig Dispense Refill   albuterol (PROVENTIL) (2.5 MG/3ML) 0.083% nebulizer solution USE 1 VIAL VIA NEBULIZER EVERY 6 HOURS AS NEEDED 75 mL 3   albuterol (PROVENTIL) (2.5 MG/3ML) 0.083% nebulizer solution USE 1 VIAL VIA NEBULIZER EVERY 6 HOURS AS NEEDED 75 mL 3   albuterol (VENTOLIN HFA) 108 (90 Base) MCG/ACT inhaler Inhale 1-2 puffs into the lungs every 4 (four) hours as needed for wheezing or shortness of breath. 8.5 g 5   amLODipine (NORVASC) 5 MG tablet Take 5 mg by mouth at bedtime.      atorvastatin (LIPITOR) 10 MG tablet Take 10 mg by mouth daily.     budesonide-formoterol (SYMBICORT) 160-4.5 MCG/ACT inhaler INHALE 2 PUFFS INTO THE LUNGS TWICE DAILY 10.2 g 5   flunisolide (NASALIDE) 25 MCG/ACT (0.025%) SOLN USE 1 SPRAY IN EACH NOSTRIL TWICE DAILY AS NEEDED FOR ALLERGIES 25 mL 5   loratadine (CLARITIN) 10 MG tablet Take 10 mg by mouth daily as needed for allergies.      losartan (COZAAR) 50 MG tablet Take 1 tablet (50 mg total) by mouth daily. 90 tablet 3    SPIRIVA HANDIHALER 18 MCG inhalation capsule INHALE THE CONTENTS OF 1 CAPSULE VIA INHALATION DEVICE EVERY DAY 30 capsule 12   Vitamin D, Ergocalciferol, (DRISDOL) 50000 UNITS CAPS Take 50,000 Units by mouth every Monday.     amoxicillin-clavulanate (AUGMENTIN) 875-125 MG tablet Take 1 tablet by mouth 2 (two) times daily. 14 tablet 0   No facility-administered medications prior to visit.    Review of Systems  Constitutional:  Negative for chills, fever, malaise/fatigue and weight loss.  HENT:  Negative for congestion, sinus pain  and sore throat.   Eyes: Negative.   Respiratory:  Positive for cough. Negative for hemoptysis, sputum production (in AM), shortness of breath and wheezing.   Cardiovascular:  Negative for chest pain, palpitations, orthopnea, claudication and leg swelling.  Gastrointestinal:  Negative for abdominal pain, heartburn, nausea and vomiting.  Genitourinary: Negative.   Musculoskeletal:  Negative for joint pain and myalgias.  Skin:  Negative for rash.  Neurological:  Negative for weakness.  Endo/Heme/Allergies:  Positive for environmental allergies.  Psychiatric/Behavioral: Negative.      Objective:   Vitals:   04/10/23 1008  BP: (!) 120/58  Pulse: 100  SpO2: 95%  Weight: 93 lb 6.4 oz (42.4 kg)  Height: 4' 11.5" (1.511 m)    Physical Exam Constitutional:      General: She is not in acute distress.    Appearance: She is not ill-appearing.  HENT:     Head: Normocephalic and atraumatic.  Eyes:     General: No scleral icterus.    Conjunctiva/sclera: Conjunctivae normal.  Cardiovascular:     Rate and Rhythm: Normal rate and regular rhythm.     Pulses: Normal pulses.     Heart sounds: Normal heart sounds. No murmur heard. Pulmonary:     Effort: Pulmonary effort is normal.     Breath sounds: No wheezing, rhonchi or rales.  Musculoskeletal:     Right lower leg: No edema.     Left lower leg: No edema.  Skin:    General: Skin is warm and dry.   Neurological:     Mental Status: She is alert.     CBC    Component Value Date/Time   WBC 9.9 11/25/2017 1347   RBC 4.15 11/25/2017 1347   HGB 13.9 11/25/2017 1347   HCT 42.4 11/25/2017 1347   PLT 295 11/25/2017 1347   MCV 102.2 (H) 11/25/2017 1347   MCH 33.5 11/25/2017 1347   MCHC 32.8 11/25/2017 1347   RDW 11.9 11/25/2017 1347   LYMPHSABS 0.5 (L) 02/04/2013 0743   MONOABS 0.9 02/04/2013 0743   EOSABS 0.0 02/04/2013 0743   BASOSABS 0.0 02/04/2013 0743      Latest Ref Rng & Units 11/25/2017    1:47 PM 02/05/2013    3:32 AM 02/04/2013    7:43 AM  BMP  Glucose 70 - 99 mg/dL 91  409  811   BUN 8 - 23 mg/dL 7  12  7    Creatinine 0.44 - 1.00 mg/dL 9.14  7.82  9.56   Sodium 135 - 145 mmol/L 129  131  131   Potassium 3.5 - 5.1 mmol/L 4.1  4.2  4.6   Chloride 98 - 111 mmol/L 97  95  95   CO2 22 - 32 mmol/L 23  22  21    Calcium 8.9 - 10.3 mg/dL 9.5  9.2  8.8    Chest imaging: CT Chest LCS 08/22/22 1. Lung-RADS 2, benign appearance or behavior. Continue annual screening with low-dose chest CT without contrast in 12 months. 2. Aortic atherosclerosis (ICD10-I70.0). Coronary artery calcification. 3.  Emphysema  CT Chest LCS 02/06/21 Mediastinum/Nodes: No pathologically enlarged lymph nodes seen in the chest. Mildly patulous esophagus.   Lungs/Pleura: Central airways are patent. Scattered areas of bronchial wall thickening and mucous plugging seen primarily in the lower lungs, likely due to aspiration. No consolidation, pleural effusion or pneumothorax. Stable small solid pulmonary nodules measuring less than 6 mm.  CXR 12/08/19: reviewed centrilobular and paraseptal emphysema and biapical pleuroparenchymal scarring noted. Tiny  pulmonary nodules are stable.  PFT:    Latest Ref Rng & Units 12/26/2015    9:58 AM  PFT Results  FVC-Pre L 2.15  P  FVC-Predicted Pre % 78  P  FVC-Post L 2.33  P  FVC-Predicted Post % 85  P  Pre FEV1/FVC % % 46  P  Post FEV1/FCV % % 49  P   FEV1-Pre L 1.00  P  FEV1-Predicted Pre % 47  P  FEV1-Post L 1.15  P  DLCO uncorrected ml/min/mmHg 14.82  P  DLCO UNC% % 75  P  DLCO corrected ml/min/mmHg 14.64  P  DLCO COR %Predicted % 74  P  DLVA Predicted % 75  P    P Preliminary result  2017 - Severe obstruction with significant bronchodilator reversibility.  Mild diffusion limitation.  Normal lung volumes.  Flow volume loop consistent with obstruction.     Assessment & Plan:   Centrilobular emphysema (HCC) - Plan: montelukast (SINGULAIR) 10 MG tablet  Seasonal allergies - Plan: montelukast (SINGULAIR) 10 MG tablet  Discussion: Tishanna Dunford is a 73 year old woman, former smoker with COPD who returns to pulmonary clinic for follow up.   She continues to do well on Spiriva daily and Symbicort twice daily.   Continue claritin daily and start montelukast 10mg  daily  She is to continue follow up in our lung cancer screening program.    Follow up in 6 months. Follow up sooner if needed.  Melody Comas, MD [facility] Office: [phone]   Current Outpatient Medications:    albuterol (PROVENTIL) (2.5 MG/3ML) 0.083% nebulizer solution, USE 1 VIAL VIA NEBULIZER EVERY 6 HOURS AS NEEDED, Disp: 75 mL, Rfl: 3   albuterol (PROVENTIL) (2.5 MG/3ML) 0.083% nebulizer solution, USE 1 VIAL VIA NEBULIZER EVERY 6 HOURS AS NEEDED, Disp: 75 mL, Rfl: 3   albuterol (VENTOLIN HFA) 108 (90 Base) MCG/ACT inhaler, Inhale 1-2 puffs into the lungs every 4 (four) hours as needed for wheezing or shortness of breath., Disp: 8.5 g, Rfl: 5   amLODipine (NORVASC) 5 MG tablet, Take 5 mg by mouth at bedtime. , Disp: , Rfl:    atorvastatin (LIPITOR) 10 MG tablet, Take 10 mg by mouth daily., Disp: , Rfl:    budesonide-formoterol (SYMBICORT) 160-4.5 MCG/ACT inhaler, INHALE 2 PUFFS INTO THE LUNGS TWICE DAILY, Disp: 10.2 g, Rfl: 5   flunisolide (NASALIDE) 25 MCG/ACT (0.025%) SOLN, USE 1 SPRAY IN EACH NOSTRIL TWICE DAILY AS NEEDED FOR  ALLERGIES, Disp: 25 mL, Rfl: 5   loratadine (CLARITIN) 10 MG tablet, Take 10 mg by mouth daily as needed for allergies. , Disp: , Rfl:    losartan (COZAAR) 50 MG tablet, Take 1 tablet (50 mg total) by mouth daily., Disp: 90 tablet, Rfl: 3   montelukast (SINGULAIR) 10 MG tablet, Take 1 tablet (10 mg total) by mouth at bedtime., Disp: 30 tablet, Rfl: 11   SPIRIVA HANDIHALER 18 MCG inhalation capsule, INHALE THE CONTENTS OF 1 CAPSULE VIA INHALATION DEVICE EVERY DAY, Disp: 30 capsule, Rfl: 12   Vitamin D, Ergocalciferol, (DRISDOL) 50000 UNITS CAPS, Take 50,000 Units by mouth every Monday., Disp: , Rfl:    amoxicillin-clavulanate (AUGMENTIN) 875-125 MG tablet, Take 1 tablet by mouth 2 (two) times daily., Disp: 14 tablet, Rfl: 0

## 2023-04-10 NOTE — Patient Instructions (Addendum)
 Continue symbicort 2 puffs twice daily - rinse mouth out after each use  Continue spiriva handihaler daily  Use albuterol inhaler or neb every 4-6 hours as needed  Start montelukast 10mg  daily  Continue claritin daily for allergies  Follow up in 6 months, call sooner if needed

## 2023-07-03 ENCOUNTER — Other Ambulatory Visit: Payer: Self-pay | Admitting: Pulmonary Disease

## 2023-08-08 ENCOUNTER — Encounter: Payer: Self-pay | Admitting: Acute Care

## 2023-08-21 ENCOUNTER — Other Ambulatory Visit: Payer: Self-pay | Admitting: Acute Care

## 2023-08-21 DIAGNOSIS — Z122 Encounter for screening for malignant neoplasm of respiratory organs: Secondary | ICD-10-CM

## 2023-08-21 DIAGNOSIS — Z87891 Personal history of nicotine dependence: Secondary | ICD-10-CM

## 2023-09-02 ENCOUNTER — Ambulatory Visit (HOSPITAL_BASED_OUTPATIENT_CLINIC_OR_DEPARTMENT_OTHER)
Admission: RE | Admit: 2023-09-02 | Discharge: 2023-09-02 | Disposition: A | Discharge status: Home / Self Care | Source: Ambulatory Visit | Attending: Acute Care | Admitting: Acute Care

## 2023-09-02 DIAGNOSIS — Z122 Encounter for screening for malignant neoplasm of respiratory organs: Secondary | ICD-10-CM | POA: Insufficient documentation

## 2023-09-02 DIAGNOSIS — Z87891 Personal history of nicotine dependence: Secondary | ICD-10-CM | POA: Insufficient documentation

## 2023-09-17 ENCOUNTER — Other Ambulatory Visit: Payer: Self-pay | Admitting: Acute Care

## 2023-09-17 DIAGNOSIS — Z87891 Personal history of nicotine dependence: Secondary | ICD-10-CM

## 2023-09-17 DIAGNOSIS — Z122 Encounter for screening for malignant neoplasm of respiratory organs: Secondary | ICD-10-CM

## 2023-10-15 ENCOUNTER — Ambulatory Visit (INDEPENDENT_AMBULATORY_CARE_PROVIDER_SITE_OTHER): Admitting: Pulmonary Disease

## 2023-10-15 ENCOUNTER — Encounter: Payer: Self-pay | Admitting: Pulmonary Disease

## 2023-10-15 VITALS — BP 137/73 | HR 94 | Ht 59.5 in | Wt 96.4 lb

## 2023-10-15 DIAGNOSIS — J432 Centrilobular emphysema: Secondary | ICD-10-CM | POA: Diagnosis not present

## 2023-10-15 DIAGNOSIS — Z87891 Personal history of nicotine dependence: Secondary | ICD-10-CM | POA: Diagnosis not present

## 2023-10-15 DIAGNOSIS — J302 Other seasonal allergic rhinitis: Secondary | ICD-10-CM

## 2023-10-15 DIAGNOSIS — R609 Edema, unspecified: Secondary | ICD-10-CM | POA: Diagnosis not present

## 2023-10-15 MED ORDER — BUDESONIDE-FORMOTEROL FUMARATE 160-4.5 MCG/ACT IN AERO: 2.0000 | INHALATION_SPRAY | Freq: Two times a day (BID) | RESPIRATORY_TRACT | 11 refills | Status: AC

## 2023-10-15 MED ORDER — TIOTROPIUM BROMIDE MONOHYDRATE 18 MCG IN CAPS
18.0000 ug | ORAL_CAPSULE | Freq: Every day | RESPIRATORY_TRACT | 11 refills | Status: AC
Start: 2023-10-15 — End: ?

## 2023-10-15 NOTE — Progress Notes (Unsigned)
 Synopsis: Referred for COPD by Jolee Madelin Patch, MD.  Previously a patient of Dr. Christi and Dr. Gretta.  Subjective:   PATIENT ID: Comer Amber Chaney GENDER: female DOB: Sep 07, 1950, MRN: [id number]  HPI  Chief Complaint  Patient presents with   Medical Management of Chronic Issues   Amber Chaney is a 73 year old woman, former smoker with COPD who returns to pulmonary clinic for follow up.   She experiences seasonal allergies during spring, summer, and fall, managed with montelukast  at bedtime. Her COPD is managed with Spiriva  and Symbicort . She rarely requires her albuterol  rescue inhaler, using it about twice a year during allergy seasons. A nebulizer is also used during these times.  She has no shortness of breath during activities such as dressing, showering, or bathing and has not stopped any activities due to her respiratory conditions. Occasional leg swelling occurs after prolonged standing, resolving by the next morning.  OV 03/21/23 She has been experiencing an exacerbation of her seasonal allergies over the past week, characterized by stuffy sinuses, watery eyes, coughing, and sneezing.   Her current medication regimen includes Symbicort , two puffs twice daily, and Spiriva  once daily. She uses albuterol  via a nebulizer as needed. She also takes loratadine (Claritin) for allergy management.  OV 03/22/22 She has been doing well since last follow up. She is using symbicort  twice daily and spiriva  daily. She has morning chest congestion. She is not using albuterol  much. She is seeing ENT, Dr. 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   Past Medical History:  Diagnosis Date   Allergic rhinitis    Cancer (HCC)    skin cancer. 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    Vaping Use   Vaping status: Former  Substance and Sexual Activity   Alcohol use: Yes    Alcohol/week: 14.0 standard drinks of alcohol    Types: 14 Cans of beer per week   Drug use: No   Sexual activity: Yes  Other Topics Concern   Not on file  Social History Narrative   Not on file   Social Drivers of Health   Financial Resource Strain: Not on file  Food Insecurity: Low Risk  (11/01/2022)   Received from [facility]   Hunger Vital Sign    Within the past 12 months, you worried that your food would run out before you got money to buy more: Never true    Within the  past 12 months, the food you bought just didn't last and you didn't have money to get more. : Never true  Transportation Needs: No Transportation Needs (11/01/2022)   Received from [facility]    In the past 12 months, has lack of reliable transportation kept you from medical appointments, meetings, work or from getting things needed for daily living? : No  Physical Activity: Not on file  Stress: Not on file  Social Connections: Not on file  Intimate Partner Violence: Not on file     Allergies  Allergen Reactions   A-G Pro Other (See Comments)    Shots cause tumors in arms     Outpatient Medications Prior to Visit  Medication Sig Dispense Refill   albuterol  (PROVENTIL ) (2.5 MG/3ML) 0.083% nebulizer  solution USE 1 VIAL VIA NEBULIZER EVERY 6 HOURS AS NEEDED 75 mL 3   albuterol  (VENTOLIN  HFA) 108 (90 Base) MCG/ACT inhaler Inhale 1-2 puffs into the lungs every 4 (four) hours as needed for wheezing or shortness of breath. 8.5 g 5   amLODipine (NORVASC) 5 MG tablet Take 5 mg by mouth at bedtime.      atorvastatin (LIPITOR) 10 MG tablet Take 10 mg by mouth daily.     flunisolide  (NASALIDE ) 25 MCG/ACT (0.025%) SOLN USE 1 SPRAY IN EACH NOSTRIL TWICE DAILY AS NEEDED FOR ALLERGIES 25 mL 5   loratadine (CLARITIN) 10 MG tablet Take 10 mg by mouth daily as needed for allergies.      losartan  (COZAAR ) 50 MG tablet Take 1 tablet (50 mg total) by mouth daily. 90 tablet 3   montelukast  (SINGULAIR ) 10 MG tablet Take 1 tablet (10 mg total) by mouth at bedtime. 30 tablet 11   Vitamin D , Ergocalciferol , (DRISDOL ) 50000 UNITS CAPS Take 50,000 Units by mouth every Monday.     SPIRIVA  HANDIHALER 18 MCG inhalation capsule INHALE THE CONTENTS OF 1 CAPSULE VIA INHALATION DEVICE EVERY DAY 30 capsule 12   SYMBICORT  160-4.5 MCG/ACT inhaler INHALE 2 PUFFS INTO THE LUNGS TWICE DAILY 10.2 g 5   albuterol  (PROVENTIL ) (2.5 MG/3ML) 0.083% nebulizer solution USE 1 VIAL VIA NEBULIZER EVERY 6 HOURS AS NEEDED 75 mL 3   No facility-administered medications prior to visit.    Review of Systems  Constitutional:  Negative for chills, fever, malaise/fatigue and weight loss.  HENT:  Negative for congestion, sinus pain and sore throat.   Eyes: Negative.   Respiratory:  Negative for cough, hemoptysis, sputum production, shortness of breath and wheezing.   Cardiovascular:  Negative for chest pain, palpitations, orthopnea, claudication and leg swelling.  Gastrointestinal:  Negative for abdominal pain, heartburn, nausea and vomiting.  Genitourinary: Negative.   Musculoskeletal:  Negative for joint pain and myalgias.  Skin:  Negative for rash.  Neurological:  Negative for weakness.  Endo/Heme/Allergies:  Positive for environmental  allergies.  Psychiatric/Behavioral: Negative.      Objective:   Vitals:   10/15/23 0958  BP: 137/73  Pulse: 94  SpO2: 94%  Weight: 96 lb 6.4 oz (43.7 kg)  Height: 4' 11.5 (1.511 m)    Physical Exam Constitutional:      General: She is not in acute distress.    Appearance: She is not ill-appearing.  HENT:     Head: Normocephalic and atraumatic.  Eyes:     General: No scleral icterus.    Conjunctiva/sclera: Conjunctivae normal.  Cardiovascular:     Rate and Rhythm: Normal rate and regular rhythm.     Pulses: Normal pulses.  Heart sounds: Normal heart sounds. No murmur heard. Pulmonary:     Effort: Pulmonary effort is normal.     Breath sounds: No wheezing, rhonchi or rales.  Musculoskeletal:     Right lower leg: No edema.     Left lower leg: No edema.  Skin:    General: Skin is warm and dry.  Neurological:     Mental Status: She is alert.     CBC    Component Value Date/Time   WBC 9.9 11/25/2017 1347   RBC 4.15 11/25/2017 1347   HGB 13.9 11/25/2017 1347   HCT 42.4 11/25/2017 1347   PLT 295 11/25/2017 1347   MCV 102.2 (H) 11/25/2017 1347   MCH 33.5 11/25/2017 1347   MCHC 32.8 11/25/2017 1347   RDW 11.9 11/25/2017 1347   LYMPHSABS 0.5 (L) 02/04/2013 0743   MONOABS 0.9 02/04/2013 0743   EOSABS 0.0 02/04/2013 0743   BASOSABS 0.0 02/04/2013 0743      Latest Ref Rng & Units 11/25/2017    1:47 PM 02/05/2013    3:32 AM 02/04/2013    7:43 AM  BMP  Glucose 70 - 99 mg/dL 91  828  877   BUN 8 - 23 mg/dL 7  12  7    Creatinine 0.44 - 1.00 mg/dL 9.30  9.18  9.21   Sodium 135 - 145 mmol/L 129  131  131   Potassium 3.5 - 5.1 mmol/L 4.1  4.2  4.6   Chloride 98 - 111 mmol/L 97  95  95   CO2 22 - 32 mmol/L 23  22  21    Calcium 8.9 - 10.3 mg/dL 9.5  9.2  8.8    Chest imaging: CT Chest LCS 08/22/22 1. Lung-RADS 2, benign appearance or behavior. Continue annual screening with low-dose chest CT without contrast in 12 months. 2. Aortic atherosclerosis (ICD10-I70.0).  Coronary artery calcification. 3.  Emphysema  CT Chest LCS 02/06/21 Mediastinum/Nodes: No pathologically enlarged lymph nodes seen in the chest. Mildly patulous esophagus.   Lungs/Pleura: Central airways are patent. Scattered areas of bronchial wall thickening and mucous plugging seen primarily in the lower lungs, likely due to aspiration. No consolidation, pleural effusion or pneumothorax. Stable small solid pulmonary nodules measuring less than 6 mm.  CXR 12/08/19: reviewed centrilobular and paraseptal emphysema and biapical pleuroparenchymal scarring noted. Tiny pulmonary nodules are stable.  PFT:    Latest Ref Rng & Units 12/26/2015    9:58 AM  PFT Results  FVC-Pre L 2.15  P  FVC-Predicted Pre % 78  P  FVC-Post L 2.33  P  FVC-Predicted Post % 85  P  Pre FEV1/FVC % % 46  P  Post FEV1/FCV % % 49  P  FEV1-Pre L 1.00  P  FEV1-Predicted Pre % 47  P  FEV1-Post L 1.15  P  DLCO uncorrected ml/min/mmHg 14.82  P  DLCO UNC% % 75  P  DLCO corrected ml/min/mmHg 14.64  P  DLCO COR %Predicted % 74  P  DLVA Predicted % 75  P    P Preliminary result  2017 - Severe obstruction with significant bronchodilator reversibility.  Mild diffusion limitation.  Normal lung volumes.  Flow volume loop consistent with obstruction.     Assessment & Plan:   Centrilobular emphysema (HCC) - Plan: tiotropium (SPIRIVA  HANDIHALER) 18 MCG inhalation capsule, budesonide -formoterol  (SYMBICORT ) 160-4.5 MCG/ACT inhaler  Seasonal allergies  Discussion: Amber Chaney is a 73 year old woman, former smoker with COPD who returns to pulmonary clinic for follow  up.   Centrilobular emphysema Well-controlled with Spiriva  and Symbicort . Minimal albuterol  use. Seasonal exacerbations noted. - Continue Spiriva . - Continue Symbicort , 2 puffs twice a day. - Use albuterol  as needed. - Resume montelukast  (Singulair ) at bedtime during fall allergy season.  Peripheral edema, intermittent Intermittent edema after  prolonged standing, resolves by morning. Suggests venous insufficiency. - Consider using compression stockings during prolonged periods of standing.  Follow up in 1 year, call sooner if needed  Dorn Chill, MD [facility] Office: [phone]   Current Outpatient Medications:    albuterol  (PROVENTIL ) (2.5 MG/3ML) 0.083% nebulizer solution, USE 1 VIAL VIA NEBULIZER EVERY 6 HOURS AS NEEDED, Disp: 75 mL, Rfl: 3   albuterol  (VENTOLIN  HFA) 108 (90 Base) MCG/ACT inhaler, Inhale 1-2 puffs into the lungs every 4 (four) hours as needed for wheezing or shortness of breath., Disp: 8.5 g, Rfl: 5   amLODipine (NORVASC) 5 MG tablet, Take 5 mg by mouth at bedtime. , Disp: , Rfl:    atorvastatin (LIPITOR) 10 MG tablet, Take 10 mg by mouth daily., Disp: , Rfl:    flunisolide  (NASALIDE ) 25 MCG/ACT (0.025%) SOLN, USE 1 SPRAY IN EACH NOSTRIL TWICE DAILY AS NEEDED FOR ALLERGIES, Disp: 25 mL, Rfl: 5   loratadine (CLARITIN) 10 MG tablet, Take 10 mg by mouth daily as needed for allergies. , Disp: , Rfl:    losartan  (COZAAR ) 50 MG tablet, Take 1 tablet (50 mg total) by mouth daily., Disp: 90 tablet, Rfl: 3   montelukast  (SINGULAIR ) 10 MG tablet, Take 1 tablet (10 mg total) by mouth at bedtime., Disp: 30 tablet, Rfl: 11   Vitamin D , Ergocalciferol , (DRISDOL ) 50000 UNITS CAPS, Take 50,000 Units by mouth every Monday., Disp: , Rfl:    budesonide -formoterol  (SYMBICORT ) 160-4.5 MCG/ACT inhaler, Inhale 2 puffs into the lungs 2 (two) times daily., Disp: 10.2 g, Rfl: 11   tiotropium (SPIRIVA  HANDIHALER) 18 MCG inhalation capsule, Place 1 capsule (18 mcg total) into inhaler and inhale daily., Disp: 30 capsule, Rfl: 11

## 2023-10-15 NOTE — Patient Instructions (Addendum)
 Continue symbicort  2 puffs twice daily - rinse mouth out after each use  Continue spiriva  handihaler daily  Use albuterol  inhaler or neb every 4-6 hours as needed  Continue montelukast  10mg  daily  Continue claritin daily for allergies  Follow up in 1 year

## 2023-10-18 ENCOUNTER — Encounter: Payer: Self-pay | Admitting: Pulmonary Disease
